# Patient Record
Sex: Male | Born: 1985
Health system: Southern US, Community
[De-identification: ages and names within clinical notes are randomized; demographics above are authoritative.]

## PROBLEM LIST (undated history)

## (undated) DIAGNOSIS — F17200 Nicotine dependence, unspecified, uncomplicated: Secondary | ICD-10-CM

## (undated) DIAGNOSIS — H409 Unspecified glaucoma: Secondary | ICD-10-CM

## (undated) DIAGNOSIS — D649 Anemia, unspecified: Secondary | ICD-10-CM

## (undated) DIAGNOSIS — F1020 Alcohol dependence, uncomplicated: Secondary | ICD-10-CM

## (undated) DIAGNOSIS — Z9889 Other specified postprocedural states: Secondary | ICD-10-CM

## (undated) DIAGNOSIS — K37 Unspecified appendicitis: Secondary | ICD-10-CM

## (undated) DIAGNOSIS — K56609 Unspecified intestinal obstruction, unspecified as to partial versus complete obstruction: Secondary | ICD-10-CM

## (undated) DIAGNOSIS — Z8719 Personal history of other diseases of the digestive system: Secondary | ICD-10-CM

## (undated) HISTORY — DX: Alcohol dependence, uncomplicated: F10.20

## (undated) HISTORY — DX: Other specified postprocedural states: Z98.890

## (undated) HISTORY — PX: HERNIA REPAIR: SHX51

## (undated) HISTORY — DX: Unspecified intestinal obstruction, unspecified as to partial versus complete obstruction: K56.609

## (undated) HISTORY — DX: Nicotine dependence, unspecified, uncomplicated: F17.200

## (undated) HISTORY — DX: Personal history of other diseases of the digestive system: Z87.19

## (undated) HISTORY — DX: Anemia, unspecified: D64.9

---
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
## 2012-05-16 ENCOUNTER — Emergency Department: Payer: Self-pay | Admitting: Emergency Medicine

## 2013-01-18 ENCOUNTER — Emergency Department: Payer: Self-pay | Admitting: Emergency Medicine

## 2013-01-19 LAB — BETA STREP CULTURE(ARMC)

## 2013-05-13 DIAGNOSIS — K37 Unspecified appendicitis: Secondary | ICD-10-CM

## 2013-05-13 HISTORY — DX: Unspecified appendicitis: K37

## 2013-08-13 ENCOUNTER — Encounter (HOSPITAL_COMMUNITY): Payer: Self-pay | Admitting: Emergency Medicine

## 2013-08-13 ENCOUNTER — Observation Stay (HOSPITAL_COMMUNITY)
Admission: EM | Admit: 2013-08-13 | Discharge: 2013-08-14 | Disposition: A | Discharge status: Home / Self Care | Payer: Self-pay | Attending: General Surgery | Admitting: General Surgery

## 2013-08-13 ENCOUNTER — Encounter (HOSPITAL_COMMUNITY): Payer: Self-pay | Admitting: Certified Registered"

## 2013-08-13 ENCOUNTER — Emergency Department (HOSPITAL_COMMUNITY): Payer: Self-pay

## 2013-08-13 ENCOUNTER — Encounter (HOSPITAL_COMMUNITY)
Admission: EM | Disposition: A | Discharge status: Home / Self Care | Payer: Self-pay | Source: Home / Self Care | Attending: Emergency Medicine

## 2013-08-13 ENCOUNTER — Emergency Department (HOSPITAL_COMMUNITY): Payer: Self-pay | Admitting: Certified Registered"

## 2013-08-13 DIAGNOSIS — K3580 Unspecified acute appendicitis: Secondary | ICD-10-CM

## 2013-08-13 DIAGNOSIS — Z9049 Acquired absence of other specified parts of digestive tract: Secondary | ICD-10-CM

## 2013-08-13 DIAGNOSIS — F17200 Nicotine dependence, unspecified, uncomplicated: Secondary | ICD-10-CM | POA: Insufficient documentation

## 2013-08-13 HISTORY — PX: LAPAROSCOPIC APPENDECTOMY: SHX408

## 2013-08-13 LAB — CBC WITH DIFFERENTIAL/PLATELET
Basophils Absolute: 0 10*3/uL (ref 0.0–0.1)
Basophils Relative: 0 % (ref 0–1)
Eosinophils Absolute: 0.2 10*3/uL (ref 0.0–0.7)
Eosinophils Relative: 1 % (ref 0–5)
HEMATOCRIT: 42.8 % (ref 39.0–52.0)
HEMOGLOBIN: 15.5 g/dL (ref 13.0–17.0)
LYMPHS PCT: 8 % — AB (ref 12–46)
Lymphs Abs: 1.2 10*3/uL (ref 0.7–4.0)
MCH: 31.7 pg (ref 26.0–34.0)
MCHC: 36.2 g/dL — ABNORMAL HIGH (ref 30.0–36.0)
MCV: 87.5 fL (ref 78.0–100.0)
MONOS PCT: 7 % (ref 3–12)
Monocytes Absolute: 1 10*3/uL (ref 0.1–1.0)
NEUTROS ABS: 12.9 10*3/uL — AB (ref 1.7–7.7)
Neutrophils Relative %: 84 % — ABNORMAL HIGH (ref 43–77)
Platelets: 186 10*3/uL (ref 150–400)
RBC: 4.89 MIL/uL (ref 4.22–5.81)
RDW: 12.6 % (ref 11.5–15.5)
WBC: 15.4 10*3/uL — AB (ref 4.0–10.5)

## 2013-08-13 LAB — COMPREHENSIVE METABOLIC PANEL
ALT: 8 U/L (ref 0–53)
AST: 20 U/L (ref 0–37)
Albumin: 4.4 g/dL (ref 3.5–5.2)
Alkaline Phosphatase: 94 U/L (ref 39–117)
BILIRUBIN TOTAL: 0.7 mg/dL (ref 0.3–1.2)
BUN: 12 mg/dL (ref 6–23)
CALCIUM: 9.9 mg/dL (ref 8.4–10.5)
CHLORIDE: 99 meq/L (ref 96–112)
CO2: 27 meq/L (ref 19–32)
Creatinine, Ser: 0.89 mg/dL (ref 0.50–1.35)
GLUCOSE: 103 mg/dL — AB (ref 70–99)
Potassium: 3.7 mEq/L (ref 3.7–5.3)
Sodium: 141 mEq/L (ref 137–147)
Total Protein: 7.8 g/dL (ref 6.0–8.3)

## 2013-08-13 LAB — URINALYSIS, ROUTINE W REFLEX MICROSCOPIC
Bilirubin Urine: NEGATIVE
Glucose, UA: NEGATIVE mg/dL
Hgb urine dipstick: NEGATIVE
Ketones, ur: 15 mg/dL — AB
LEUKOCYTES UA: NEGATIVE
Nitrite: NEGATIVE
PROTEIN: NEGATIVE mg/dL
Specific Gravity, Urine: 1.005 (ref 1.005–1.030)
UROBILINOGEN UA: 0.2 mg/dL (ref 0.0–1.0)
pH: 5.5 (ref 5.0–8.0)

## 2013-08-13 LAB — LIPASE, BLOOD: LIPASE: 13 U/L (ref 11–59)

## 2013-08-13 SURGERY — APPENDECTOMY, LAPAROSCOPIC
Anesthesia: General | Site: Abdomen

## 2013-08-13 MED ORDER — KETOROLAC TROMETHAMINE 30 MG/ML IJ SOLN
15.0000 mg | Freq: Once | INTRAMUSCULAR | Status: AC | PRN
  Administered 2013-08-13: 30 mg via INTRAVENOUS

## 2013-08-13 MED ORDER — ONDANSETRON HCL 4 MG/2ML IJ SOLN
4.0000 mg | Freq: Once | INTRAMUSCULAR | Status: AC
  Administered 2013-08-13: 4 mg via INTRAVENOUS
  Filled 2013-08-13: qty 2

## 2013-08-13 MED ORDER — HEPARIN SODIUM (PORCINE) 5000 UNIT/ML IJ SOLN
5000.0000 [IU] | Freq: Three times a day (TID) | INTRAMUSCULAR | Status: DC
  Administered 2013-08-14: 5000 [IU] via SUBCUTANEOUS
  Filled 2013-08-13 (×3): qty 1

## 2013-08-13 MED ORDER — KCL IN DEXTROSE-NACL 20-5-0.45 MEQ/L-%-% IV SOLN
INTRAVENOUS | Status: AC
  Filled 2013-08-13: qty 1000

## 2013-08-13 MED ORDER — FENTANYL CITRATE 0.05 MG/ML IJ SOLN
INTRAMUSCULAR | Status: DC | PRN
  Administered 2013-08-13: 50 ug via INTRAVENOUS
  Administered 2013-08-13: 100 ug via INTRAVENOUS

## 2013-08-13 MED ORDER — HYDROMORPHONE HCL PF 1 MG/ML IJ SOLN
1.0000 mg | Freq: Once | INTRAMUSCULAR | Status: AC
Start: 2013-08-13 — End: 2013-08-13
  Administered 2013-08-13: 1 mg via INTRAVENOUS
  Filled 2013-08-13: qty 1

## 2013-08-13 MED ORDER — MIDAZOLAM HCL 5 MG/5ML IJ SOLN
INTRAMUSCULAR | Status: DC | PRN
  Administered 2013-08-13: 2 mg via INTRAVENOUS

## 2013-08-13 MED ORDER — NEOSTIGMINE METHYLSULFATE 1 MG/ML IJ SOLN
INTRAMUSCULAR | Status: DC | PRN
  Administered 2013-08-13: 4 mg via INTRAVENOUS

## 2013-08-13 MED ORDER — MIDAZOLAM HCL 2 MG/2ML IJ SOLN
INTRAMUSCULAR | Status: AC
  Filled 2013-08-13: qty 2

## 2013-08-13 MED ORDER — ONDANSETRON HCL 4 MG/2ML IJ SOLN
INTRAMUSCULAR | Status: AC
  Filled 2013-08-13: qty 2

## 2013-08-13 MED ORDER — MORPHINE SULFATE 2 MG/ML IJ SOLN
2.0000 mg | INTRAMUSCULAR | Status: DC | PRN
  Administered 2013-08-14 (×2): 2 mg via INTRAVENOUS
  Filled 2013-08-13 (×2): qty 1

## 2013-08-13 MED ORDER — MIDAZOLAM HCL 2 MG/2ML IJ SOLN: 2.0000 mg | Freq: Once | INTRAMUSCULAR | Status: DC

## 2013-08-13 MED ORDER — HYDROMORPHONE HCL PF 1 MG/ML IJ SOLN
0.2500 mg | INTRAMUSCULAR | Status: DC | PRN
  Administered 2013-08-13 (×4): 0.5 mg via INTRAVENOUS

## 2013-08-13 MED ORDER — ROCURONIUM BROMIDE 100 MG/10ML IV SOLN
INTRAVENOUS | Status: DC | PRN
  Administered 2013-08-13: 20 mg via INTRAVENOUS

## 2013-08-13 MED ORDER — GLYCOPYRROLATE 0.2 MG/ML IJ SOLN
INTRAMUSCULAR | Status: AC
  Filled 2013-08-13: qty 2

## 2013-08-13 MED ORDER — HYDROMORPHONE HCL PF 1 MG/ML IJ SOLN
1.0000 mg | Freq: Once | INTRAMUSCULAR | Status: AC
  Administered 2013-08-13: 1 mg via INTRAVENOUS
  Filled 2013-08-13: qty 1

## 2013-08-13 MED ORDER — PHENYLEPHRINE 40 MCG/ML (10ML) SYRINGE FOR IV PUSH (FOR BLOOD PRESSURE SUPPORT)
PREFILLED_SYRINGE | INTRAVENOUS | Status: AC
  Filled 2013-08-13: qty 10

## 2013-08-13 MED ORDER — ONDANSETRON HCL 4 MG/2ML IJ SOLN: 4.0000 mg | Freq: Four times a day (QID) | INTRAMUSCULAR | Status: DC | PRN

## 2013-08-13 MED ORDER — ONDANSETRON HCL 4 MG/2ML IJ SOLN
INTRAMUSCULAR | Status: DC | PRN
  Administered 2013-08-13: 4 mg via INTRAVENOUS

## 2013-08-13 MED ORDER — FENTANYL CITRATE 0.05 MG/ML IJ SOLN
INTRAMUSCULAR | Status: AC
  Filled 2013-08-13: qty 5

## 2013-08-13 MED ORDER — BUPIVACAINE-EPINEPHRINE 0.25% -1:200000 IJ SOLN
INTRAMUSCULAR | Status: DC | PRN
  Administered 2013-08-13: 15 mL

## 2013-08-13 MED ORDER — ONDANSETRON HCL 4 MG/2ML IJ SOLN: 4.0000 mg | Freq: Once | INTRAMUSCULAR | Status: AC | PRN

## 2013-08-13 MED ORDER — IOHEXOL 300 MG/ML  SOLN
25.0000 mL | Freq: Once | INTRAMUSCULAR | Status: AC | PRN
  Administered 2013-08-13: 25 mL via ORAL

## 2013-08-13 MED ORDER — IOHEXOL 300 MG/ML  SOLN
100.0000 mL | Freq: Once | INTRAMUSCULAR | Status: AC | PRN
  Administered 2013-08-13: 100 mL via INTRAVENOUS

## 2013-08-13 MED ORDER — LACTATED RINGERS IV SOLN
INTRAVENOUS | Status: DC | PRN
  Administered 2013-08-13 (×2): via INTRAVENOUS

## 2013-08-13 MED ORDER — SUCCINYLCHOLINE CHLORIDE 20 MG/ML IJ SOLN
INTRAMUSCULAR | Status: AC
  Filled 2013-08-13: qty 1

## 2013-08-13 MED ORDER — BUPIVACAINE-EPINEPHRINE (PF) 0.25% -1:200000 IJ SOLN
INTRAMUSCULAR | Status: AC
  Filled 2013-08-13: qty 30

## 2013-08-13 MED ORDER — SODIUM CHLORIDE 0.9 % IR SOLN
Status: DC | PRN
  Administered 2013-08-13: 1000 mL

## 2013-08-13 MED ORDER — ACETAMINOPHEN 325 MG PO TABS: 650.0000 mg | ORAL_TABLET | ORAL | Status: DC | PRN

## 2013-08-13 MED ORDER — PROPOFOL 10 MG/ML IV BOLUS
INTRAVENOUS | Status: DC | PRN
  Administered 2013-08-13: 200 mg via INTRAVENOUS

## 2013-08-13 MED ORDER — LIDOCAINE HCL (CARDIAC) 20 MG/ML IV SOLN
INTRAVENOUS | Status: AC
  Filled 2013-08-13: qty 5

## 2013-08-13 MED ORDER — ESMOLOL HCL 10 MG/ML IV SOLN
INTRAVENOUS | Status: AC
  Filled 2013-08-13: qty 10

## 2013-08-13 MED ORDER — HYDROMORPHONE HCL PF 1 MG/ML IJ SOLN
INTRAMUSCULAR | Status: AC
  Administered 2013-08-13: 0.5 mg via INTRAVENOUS
  Filled 2013-08-13: qty 1

## 2013-08-13 MED ORDER — DEXAMETHASONE SODIUM PHOSPHATE 4 MG/ML IJ SOLN
INTRAMUSCULAR | Status: AC
  Filled 2013-08-13: qty 2

## 2013-08-13 MED ORDER — DEXAMETHASONE SODIUM PHOSPHATE 4 MG/ML IJ SOLN
INTRAMUSCULAR | Status: DC | PRN
  Administered 2013-08-13: 8 mg via INTRAVENOUS

## 2013-08-13 MED ORDER — PROPOFOL 10 MG/ML IV BOLUS
INTRAVENOUS | Status: AC
  Filled 2013-08-13: qty 20

## 2013-08-13 MED ORDER — SUCCINYLCHOLINE CHLORIDE 20 MG/ML IJ SOLN
INTRAMUSCULAR | Status: DC | PRN
Start: 2013-08-13 — End: 2013-08-13
  Administered 2013-08-13: 100 mg via INTRAVENOUS

## 2013-08-13 MED ORDER — NEOSTIGMINE METHYLSULFATE 1 MG/ML IJ SOLN
INTRAMUSCULAR | Status: AC
  Filled 2013-08-13: qty 10

## 2013-08-13 MED ORDER — LIDOCAINE HCL (CARDIAC) 20 MG/ML IV SOLN
INTRAVENOUS | Status: DC | PRN
  Administered 2013-08-13: 50 mg via INTRAVENOUS

## 2013-08-13 MED ORDER — BUPIVACAINE HCL (PF) 0.25 % IJ SOLN
INTRAMUSCULAR | Status: AC
  Filled 2013-08-13: qty 30

## 2013-08-13 MED ORDER — KCL IN DEXTROSE-NACL 20-5-0.45 MEQ/L-%-% IV SOLN
INTRAVENOUS | Status: DC
  Administered 2013-08-13: 22:00:00 via INTRAVENOUS
  Filled 2013-08-13 (×3): qty 1000

## 2013-08-13 MED ORDER — OXYCODONE-ACETAMINOPHEN 5-325 MG PO TABS
1.0000 | ORAL_TABLET | ORAL | Status: DC | PRN
  Administered 2013-08-13 – 2013-08-14 (×2): 2 via ORAL
  Filled 2013-08-13 (×2): qty 2

## 2013-08-13 MED ORDER — SODIUM CHLORIDE 0.9 % IV SOLN
1.0000 g | Freq: Once | INTRAVENOUS | Status: AC
  Administered 2013-08-13: 1 g via INTRAVENOUS
  Filled 2013-08-13: qty 1

## 2013-08-13 MED ORDER — KETOROLAC TROMETHAMINE 30 MG/ML IJ SOLN
INTRAMUSCULAR | Status: AC
  Administered 2013-08-13: 30 mg via INTRAVENOUS
  Filled 2013-08-13: qty 1

## 2013-08-13 MED ORDER — GLYCOPYRROLATE 0.2 MG/ML IJ SOLN
INTRAMUSCULAR | Status: DC | PRN
  Administered 2013-08-13: 0.6 mg via INTRAVENOUS
  Administered 2013-08-13: 0.2 mg via INTRAVENOUS

## 2013-08-13 MED ORDER — ONDANSETRON HCL 4 MG PO TABS: 4.0000 mg | ORAL_TABLET | Freq: Four times a day (QID) | ORAL | Status: DC | PRN

## 2013-08-13 SURGICAL SUPPLY — 48 items
ADH SKN CLS APL DERMABOND .7 (GAUZE/BANDAGES/DRESSINGS) ×1
APPLIER CLIP ROT 10 11.4 M/L (STAPLE)
APR CLP MED LRG 11.4X10 (STAPLE)
BAG SPEC RTRVL LRG 6X4 10 (ENDOMECHANICALS) ×2
BLADE SURG ROTATE 9660 (MISCELLANEOUS) ×2 IMPLANT
CANISTER SUCTION 2500CC (MISCELLANEOUS) ×3 IMPLANT
CHLORAPREP W/TINT 26ML (MISCELLANEOUS) ×3 IMPLANT
CLIP APPLIE ROT 10 11.4 M/L (STAPLE) IMPLANT
COVER SURGICAL LIGHT HANDLE (MISCELLANEOUS) ×3 IMPLANT
CUTTER LINEAR ENDO 35 ETS (STAPLE) IMPLANT
CUTTER LINEAR ENDO 35 ETS TH (STAPLE) ×3 IMPLANT
DECANTER SPIKE VIAL GLASS SM (MISCELLANEOUS) IMPLANT
DERMABOND ADVANCED (GAUZE/BANDAGES/DRESSINGS) ×2
DERMABOND ADVANCED .7 DNX12 (GAUZE/BANDAGES/DRESSINGS) ×1 IMPLANT
DRAPE UTILITY 15X26 W/TAPE STR (DRAPE) ×6 IMPLANT
ELECT REM PT RETURN 9FT ADLT (ELECTROSURGICAL) ×3
ELECTRODE REM PT RTRN 9FT ADLT (ELECTROSURGICAL) ×1 IMPLANT
ENDOLOOP SUT PDS II  0 18 (SUTURE)
ENDOLOOP SUT PDS II 0 18 (SUTURE) IMPLANT
GLOVE BIO SURGEON STRL SZ8 (GLOVE) ×3 IMPLANT
GLOVE BIOGEL PI IND STRL 8 (GLOVE) ×1 IMPLANT
GLOVE BIOGEL PI INDICATOR 8 (GLOVE) ×2
GOWN STRL REUS W/ TWL LRG LVL3 (GOWN DISPOSABLE) ×2 IMPLANT
GOWN STRL REUS W/ TWL XL LVL3 (GOWN DISPOSABLE) ×1 IMPLANT
GOWN STRL REUS W/TWL LRG LVL3 (GOWN DISPOSABLE) ×9
GOWN STRL REUS W/TWL XL LVL3 (GOWN DISPOSABLE) ×3
KIT BASIN OR (CUSTOM PROCEDURE TRAY) ×3 IMPLANT
KIT ROOM TURNOVER OR (KITS) ×3 IMPLANT
NEEDLE 22X1 1/2 (OR ONLY) (NEEDLE) ×3 IMPLANT
NS IRRIG 1000ML POUR BTL (IV SOLUTION) ×3 IMPLANT
PAD ARMBOARD 7.5X6 YLW CONV (MISCELLANEOUS) ×6 IMPLANT
POUCH SPECIMEN RETRIEVAL 10MM (ENDOMECHANICALS) ×5 IMPLANT
RELOAD /EVU35 (ENDOMECHANICALS) IMPLANT
RELOAD CUTTER ETS 35MM STAND (ENDOMECHANICALS) IMPLANT
SCALPEL HARMONIC ACE (MISCELLANEOUS) ×3 IMPLANT
SET IRRIG TUBING LAPAROSCOPIC (IRRIGATION / IRRIGATOR) ×3 IMPLANT
SPECIMEN JAR SMALL (MISCELLANEOUS) ×3 IMPLANT
SUT VIC AB 4-0 PS2 27 (SUTURE) ×3 IMPLANT
SWAB COLLECTION DEVICE MRSA (MISCELLANEOUS) IMPLANT
TOWEL OR 17X24 6PK STRL BLUE (TOWEL DISPOSABLE) ×3 IMPLANT
TOWEL OR 17X26 10 PK STRL BLUE (TOWEL DISPOSABLE) ×3 IMPLANT
TRAY FOLEY CATH 16FR SILVER (SET/KITS/TRAYS/PACK) ×3 IMPLANT
TRAY LAPAROSCOPIC (CUSTOM PROCEDURE TRAY) ×3 IMPLANT
TROCAR XCEL 12X100 BLDLESS (ENDOMECHANICALS) ×3 IMPLANT
TROCAR XCEL BLUNT TIP 100MML (ENDOMECHANICALS) ×3 IMPLANT
TROCAR XCEL NON-BLD 5MMX100MML (ENDOMECHANICALS) ×3 IMPLANT
TUBE ANAEROBIC SPECIMEN COL (MISCELLANEOUS) IMPLANT
WATER STERILE IRR 1000ML POUR (IV SOLUTION) ×1 IMPLANT

## 2013-08-13 NOTE — Transfer of Care (Signed)
Immediate Anesthesia Transfer of Care Note  Patient: Mitchell [name]  Procedure(s) Performed: Procedure(s): APPENDECTOMY LAPAROSCOPIC (N/A)  Patient Location: PACU  Anesthesia Type:General  Level of Consciousness: awake, alert , oriented and patient cooperative  Airway & Oxygen Therapy: Patient Spontanous Breathing and Patient connected to nasal cannula oxygen  Post-op Assessment: Report given to PACU RN, Post -op Vital signs reviewed and stable and Patient moving all extremities  Post vital signs: Reviewed and stable  Complications: No apparent anesthesia complications

## 2013-08-13 NOTE — ED Notes (Signed)
Pt. Complaints of upper abdominal cramping with N/V since yesterday. Pt. Curled over in bed, guarding abdomen.

## 2013-08-13 NOTE — Anesthesia Procedure Notes (Signed)
Procedure Name: Intubation Date/Time: 08/13/2013 8:27 PM Performed by: Jerilee HohMUMM, Arney Mayabb N Pre-anesthesia Checklist: Patient identified, Emergency Drugs available, Suction available and Patient being monitored Patient Re-evaluated:Patient Re-evaluated prior to inductionOxygen Delivery Method: Circle system utilized Preoxygenation: Pre-oxygenation with 100% oxygen Intubation Type: IV induction, Rapid sequence and Cricoid Pressure applied Laryngoscope Size: Mac and 4 Grade View: Grade II Tube type: Oral Tube size: 7.5 mm Number of attempts: 1 Airway Equipment and Method: Stylet Placement Confirmation: ETT inserted through vocal cords under direct vision,  positive ETCO2 and breath sounds checked- equal and bilateral Secured at: 21 cm Tube secured with: Tape Dental Injury: Teeth and Oropharynx as per pre-operative assessment

## 2013-08-13 NOTE — H&P (Signed)
Mitchell [name] is an 28 y.o. male.   Chief Complaint: abdominal pain HPI: patient developed epigastric pain last night. It persistes and he had associated nausea. He came to the ED for evaluation. He has leukocytosis of 15k and CT abdomen and pelvis shows acute appendicitis. History reviewed. No pertinent past medical history.  History reviewed. No pertinent past surgical history.  History reviewed. No pertinent family history. Social History:  reports that he has been smoking.  He does not have any smokeless tobacco history on file. His alcohol and drug histories are not on file.  Allergies: No Known Allergies   (Not in a hospital admission)  Results for orders placed during the hospital encounter of 08/13/13 (from the past 48 hour(s))  CBC WITH DIFFERENTIAL     Status: Abnormal   Collection Time    08/13/13  3:18 PM      Result Value Ref Range   WBC 15.4 (*) 4.0 - 10.5 K/uL   RBC 4.89  4.22 - 5.81 MIL/uL   Hemoglobin 15.5  13.0 - 17.0 g/dL   HCT 42.8  39.0 - 52.0 %   MCV 87.5  78.0 - 100.0 fL   MCH 31.7  26.0 - 34.0 pg   MCHC 36.2 (*) 30.0 - 36.0 g/dL   RDW 12.6  11.5 - 15.5 %   Platelets 186  150 - 400 K/uL   Neutrophils Relative % 84 (*) 43 - 77 %   Neutro Abs 12.9 (*) 1.7 - 7.7 K/uL   Lymphocytes Relative 8 (*) 12 - 46 %   Lymphs Abs 1.2  0.7 - 4.0 K/uL   Monocytes Relative 7  3 - 12 %   Monocytes Absolute 1.0  0.1 - 1.0 K/uL   Eosinophils Relative 1  0 - 5 %   Eosinophils Absolute 0.2  0.0 - 0.7 K/uL   Basophils Relative 0  0 - 1 %   Basophils Absolute 0.0  0.0 - 0.1 K/uL  COMPREHENSIVE METABOLIC PANEL     Status: Abnormal   Collection Time    08/13/13  3:18 PM      Result Value Ref Range   Sodium 141  137 - 147 mEq/L   Potassium 3.7  3.7 - 5.3 mEq/L   Chloride 99  96 - 112 mEq/L   CO2 27  19 - 32 mEq/L   Glucose, Bld 103 (*) 70 - 99 mg/dL   BUN 12  6 - 23 mg/dL   Creatinine, Ser 0.89  0.50 - 1.35 mg/dL   Calcium 9.9  8.4 - 10.5 mg/dL   Total Protein 7.8   6.0 - 8.3 g/dL   Albumin 4.4  3.5 - 5.2 g/dL   AST 20  0 - 37 U/L   ALT 8  0 - 53 U/L   Alkaline Phosphatase 94  39 - 117 U/L   Total Bilirubin 0.7  0.3 - 1.2 mg/dL   GFR calc non Af Amer >90  >90 mL/min   GFR calc Af Amer >90  >90 mL/min   Comment: (NOTE)     The eGFR has been calculated using the CKD EPI equation.     This calculation has not been validated in all clinical situations.     eGFR's persistently <90 mL/min signify possible Chronic Kidney     Disease.  LIPASE, BLOOD     Status: None   Collection Time    08/13/13  3:18 PM      Result Value Ref Range  Lipase 13  11 - 59 U/L   Ct Abdomen Pelvis W Contrast  08/13/2013   CLINICAL DATA:  Abdominal pain, nausea  EXAM: CT ABDOMEN AND PELVIS WITH CONTRAST  TECHNIQUE: Multidetector CT imaging of the abdomen and pelvis was performed using the standard protocol following bolus administration of intravenous contrast.  CONTRAST:  166m OMNIPAQUE IOHEXOL 300 MG/ML  SOLN  COMPARISON:  None.  FINDINGS: Lung bases are unremarkable. Sagittal images of the spine are unremarkable. Liver, pancreas, spleen and adrenals are unremarkable. No calcified gallstones are noted within gallbladder. Abdominal aorta is unremarkable. Kidneys are unremarkable. No hydronephrosis or hydroureter. Moderate stool in transverse colon. No small bowel obstruction. There is subtle mild stranding of pericolonic fat in right lower quadrant  A low lying cecum is noted. Nonspecific lymph nodes are noted in right lower quadrant mesentery the largest measures 1.2 cm in coronal image 41.  Moderate distension of the cecum with gas and stool. There is partially visualized abnormal appendix. There is a calcified appendicolith at the appendix base measures about 1 cm best seen in sagittal image 57. There is distension of the appendix with fluid the appendix measures at least 1 cm in diameter. The appendix is located just above the urinary bladder best seen in sagittal image 59. A second  appendicolith is noted within appendix measures about 7 mm. Findings are consistent with acute appendicitis.  The appendix is visualized in axial image 67. The appendix is somewhat medial to the cecum just above the urinary bladder.  IMPRESSION: 1. Findings highly suspicious for acute appendicitis. At least 2 appendicoliths are noted as described above. There is a low lying cecum. Stool and gas noted within cecum. 2. No hydronephrosis or hydroureter. 3. No small bowel obstruction.  These results were called by telephone at the time of interpretation on 08/13/2013 at 5:49 PM to Dr. TJeannett Senior, who verbally acknowledged these results.   Electronically Signed   By: LLahoma CrockerM.D.   On: 08/13/2013 17:49   Dg Abd Acute W/chest  08/13/2013   CLINICAL DATA:  Abdominal pain and vomiting  EXAM: ACUTE ABDOMEN SERIES (ABDOMEN 2 VIEW & CHEST 1 VIEW)  COMPARISON:  None.  FINDINGS: PA chest: Lungs are clear. Heart size and pulmonary vascularity are normal. No adenopathy.  Supine and upright abdomen: There is moderate stool in the colon. There is no bowel dilatation or air-fluid level suggesting obstruction. No free air. No abnormal calcifications.  IMPRESSION: Bowel gas pattern unremarkable.  Lungs clear.   Electronically Signed   By: WLowella GripM.D.   On: 08/13/2013 16:14    Review of Systems  Constitutional: Positive for malaise/fatigue. Negative for chills.  HENT: Negative.   Eyes: Negative.   Cardiovascular: Negative.   Gastrointestinal: Positive for nausea and abdominal pain.  Genitourinary: Negative.   Musculoskeletal: Negative.   Skin: Negative.   Neurological: Negative.   Endo/Heme/Allergies: Negative.   Psychiatric/Behavioral: Negative.     Blood pressure 131/72, pulse 62, temperature 98.9 F (37.2 C), temperature source Oral, resp. rate 18, SpO2 94.00%. Physical Exam  Constitutional: He is oriented to person, place, and time. He appears well-developed and well-nourished. No distress.   HENT:  Head: Normocephalic and atraumatic.  Mouth/Throat: No oropharyngeal exudate.  Eyes: EOM are normal. Pupils are equal, round, and reactive to light. No scleral icterus.  Neck: Normal range of motion. Neck supple. No tracheal deviation present.  Cardiovascular: Normal rate, regular rhythm, normal heart sounds and intact distal pulses.   Respiratory:  Effort normal and breath sounds normal. No stridor. No respiratory distress. He has no wheezes. He has no rales.  GI: Soft. He exhibits no distension. There is tenderness. There is no rebound and no guarding.  Tender RLQ  Musculoskeletal: Normal range of motion. He exhibits no tenderness.  Neurological: He is alert and oriented to person, place, and time. He exhibits normal muscle tone.  Skin: Skin is warm.  Multiple tattoos     Assessment/Plan Acute appendicitis - IV ABX and will take to the OR tonight for laparoscopic appendectomy. Procedure. Risks, benefits D/W him and he agrees.  Nikya Busler E 08/13/2013, 6:32 PM

## 2013-08-13 NOTE — Anesthesia Postprocedure Evaluation (Signed)
  Anesthesia Post-op Note  Patient: Mitchell [name]  Procedure(s) Performed: Procedure(s): APPENDECTOMY LAPAROSCOPIC (N/A)  Patient Location: PACU  Anesthesia Type:General  Level of Consciousness: awake, alert  and oriented  Airway and Oxygen Therapy: Patient Spontanous Breathing and Patient connected to nasal cannula oxygen  Post-op Pain: mild  Post-op Assessment: Post-op Vital signs reviewed, Patient's Cardiovascular Status Stable, Respiratory Function Stable, Patent Airway and Pain level controlled  Post-op Vital Signs: stable  Complications: No apparent anesthesia complications

## 2013-08-13 NOTE — Op Note (Signed)
08/13/2013  9:01 PM  PATIENT:  Mitchell [name]  28 y.o. male  PRE-OPERATIVE DIAGNOSIS:   Appendicitis  POST-OPERATIVE DIAGNOSIS:   Appendicitis  PROCEDURE:  Procedure(s): APPENDECTOMY LAPAROSCOPIC  SURGEON:  Surgeon(s): Liz MaladyBurke E Omari Mcmanaway, MD  ASSISTANTS: Gladstone PiheCe Yelverton, RNFA   ANESTHESIA:   local and general  EBL:  Total I/O In: 1000 [I.V.:1000] Out: 150 [Urine:150]  BLOOD ADMINISTERED:none  DRAINS: none   SPECIMEN:  Excision  DISPOSITION OF SPECIMEN:  PATHOLOGY  COUNTS:  YES  DICTATION: .Dragon Dictation Patient is brought for emergency appendectomy. He was done for the prep holding area. He he received intravenous antibiotics. Informed consent was obtained. He was brought to the operating room and general endotracheal anesthesia was administered by the anesthesia staff. Foley catheter was placed by nursing. Abdomen was prepped and draped in sterile fashion. Time out procedure was done. Infraumbilical region was infiltrated with local. Infraumbilical incision was made. Subcutaneous tissues were dissected down revealing the anterior fascia. This was divided along the midline. Peritoneal cavity was entered under direct vision. 0 Vicryl pursestring suture was placed around the fascial opening. Hassan trocar was inserted. Abdomen was insufflated with carbon dioxide. Under direct vision, a 12 mm left lower quadrant and 5 mm right mid abdomen port were placed. Local was used at each port site. Laparoscopic exploration revealed a very inflamed but not perforated appendix. The mesoappendix was divided with the harmonic scalpel achieving excellent hemostasis. The base of the appendix was divided with Endo GIA achieving great staple line. Appendix was placed in an Endo Catch bag and removed from the left lower corner port site. Abdomen was copiously irrigated. Was no bleeding. Good staple line on the cecum. Pneumoperitoneum was released. Ports were removed under direct vision. Infra-umbilical  fascia was closed by tying the pursestring. All 3 wounds were copiously irrigated and the skin of each was closed with running 4 Vicryl subcuticular followed by Dermabond. All counts were correct. Patient tolerated procedure well without apparent complication was taken recovery in stable condition.  PATIENT DISPOSITION:  PACU - hemodynamically stable.   Delay start of Pharmacological VTE agent (>24hrs) due to surgical blood loss or risk of bleeding:  no  Violeta GelinasBurke Carine Nordgren, MD, MPH, FACS Pager: [phone]  4/3/20159:01 PM

## 2013-08-13 NOTE — ED Provider Notes (Signed)
CSN: [id number]     Arrival date & time 08/13/13  1340 History   First MD Initiated Contact with Patient 08/13/13 1510     Chief Complaint  Patient presents with  . Abdominal Pain     (Consider location/radiation/quality/duration/timing/severity/associated sxs/prior Treatment) HPI Mitchell [name] is a 28 y.o. male who presents to emergency department complaining of upper abdominal pain since yesterday. Patient states he ate pizza last night, and shortly after developed the pain. Patient states it has been constant since then but is waxing and waning. States it is worsening today. He admits to nausea vomiting. Denies any changes in bowel movements. Denies any pain or flank. No urinary symptoms or hematuria. He took some Pepto-Bismol today with no relief of his symptoms. He states that he has had similar pain in the past but never this severe. He has never been evaluated for this pain in the past. He denies any medical history or any prior abdominal diagnoses. No abdominal surgeries in the past. He does admit to heavy alcohol, states drinks daily, large amounts. He denies any use of NSAIDs. He is a smoker. Does not drink a lot of caffeine. He denies any blood in his stool or emesis.  History reviewed. No pertinent past medical history. History reviewed. No pertinent past surgical history. History reviewed. No pertinent family history. History  Substance Use Topics  . Smoking status: Current Every Day Smoker  . Smokeless tobacco: Not on file  . Alcohol Use: Not on file    Review of Systems  Constitutional: Negative for fever and chills.  Respiratory: Negative for cough, chest tightness and shortness of breath.   Cardiovascular: Negative for chest pain, palpitations and leg swelling.  Gastrointestinal: Positive for nausea, vomiting and abdominal pain. Negative for diarrhea, blood in stool and abdominal distention.  Genitourinary: Negative for dysuria, urgency, frequency, hematuria and flank  pain.  Musculoskeletal: Negative for arthralgias, myalgias, neck pain and neck stiffness.  Skin: Negative for rash.  Allergic/Immunologic: Negative for immunocompromised state.  Neurological: Negative for dizziness, weakness, light-headedness, numbness and headaches.      Allergies  Review of patient's allergies indicates no known allergies.  Home Medications  No current outpatient prescriptions on file. BP 135/95  Pulse 64  Temp(Src) 97.6 F (36.4 C) (Oral)  Resp 18  SpO2 99% Physical Exam  Nursing note and vitals reviewed. Constitutional: He appears well-developed and well-nourished.  Appears to be in severe pain  HENT:  Head: Normocephalic and atraumatic.  Eyes: Conjunctivae are normal.  Neck: Neck supple.  Cardiovascular: Normal rate, regular rhythm and normal heart sounds.   Pulmonary/Chest: Effort normal. No respiratory distress. He has no wheezes. He has no rales.  Abdominal: Soft. Bowel sounds are normal. He exhibits no distension. There is tenderness. There is no rebound.  RUQ, LUQ, epigastric tenderness. No CVA tenderness  Musculoskeletal: He exhibits no edema.  Neurological: He is alert.  Skin: Skin is warm and dry.    ED Course  Procedures (including critical care time) Labs Review Labs Reviewed  CBC WITH DIFFERENTIAL - Abnormal; Notable for the following:    WBC 15.4 (*)    MCHC 36.2 (*)    Neutrophils Relative % 84 (*)    Neutro Abs 12.9 (*)    Lymphocytes Relative 8 (*)    All other components within normal limits  COMPREHENSIVE METABOLIC PANEL - Abnormal; Notable for the following:    Glucose, Bld 103 (*)    All other components within normal limits  LIPASE,  BLOOD  URINALYSIS, ROUTINE W REFLEX MICROSCOPIC   Imaging Review Ct Abdomen Pelvis W Contrast  08/13/2013   CLINICAL DATA:  Abdominal pain, nausea  EXAM: CT ABDOMEN AND PELVIS WITH CONTRAST  TECHNIQUE: Multidetector CT imaging of the abdomen and pelvis was performed using the standard  protocol following bolus administration of intravenous contrast.  CONTRAST:  OMNIPAQUE IOHEXOL 300 MG/ML  SOLN  COMPARISON:  None.  FINDINGS: Lung bases are unremarkable. Sagittal images of the spine are unremarkable. Liver, pancreas, spleen and adrenals are unremarkable. No calcified gallstones are noted within gallbladder. Abdominal aorta is unremarkable. Kidneys are unremarkable. No hydronephrosis or hydroureter. Moderate stool in transverse colon. No small bowel obstruction. There is subtle mild stranding of pericolonic fat in right lower quadrant  A low lying cecum is noted. Nonspecific lymph nodes are noted in right lower quadrant mesentery the largest measures 1.2 cm in coronal image 41.  Moderate distension of the cecum with gas and stool. There is partially visualized abnormal appendix. There is a calcified appendicolith at the appendix base measures about 1 cm best seen in sagittal image 57. There is distension of the appendix with fluid the appendix measures at least 1 cm in diameter. The appendix is located just above the urinary bladder best seen in sagittal image 59. A second appendicolith is noted within appendix measures about 7 mm. Findings are consistent with acute appendicitis.  The appendix is visualized in axial image 67. The appendix is somewhat medial to the cecum just above the urinary bladder.  IMPRESSION: 1. Findings highly suspicious for acute appendicitis. At least 2 appendicoliths are noted as described above. There is a low lying cecum. Stool and gas noted within cecum. 2. No hydronephrosis or hydroureter. 3. No small bowel obstruction.  These results were called by telephone at the time of interpretation on 08/13/2013 at 5:49 PM to Dr. Jaynie Crumble , who verbally acknowledged these results.   Electronically Signed   By: Natasha Mead M.D.   On: 08/13/2013 17:49   Dg Abd Acute W/chest  08/13/2013   CLINICAL DATA:  Abdominal pain and vomiting  EXAM: ACUTE ABDOMEN SERIES (ABDOMEN  2 VIEW & CHEST 1 VIEW)  COMPARISON:  None.  FINDINGS: PA chest: Lungs are clear. Heart size and pulmonary vascularity are normal. No adenopathy.  Supine and upright abdomen: There is moderate stool in the colon. There is no bowel dilatation or air-fluid level suggesting obstruction. No free air. No abnormal calcifications.  IMPRESSION: Bowel gas pattern unremarkable.  Lungs clear.   Electronically Signed   By: Bretta Bang M.D.   On: 08/13/2013 16:14     EKG Interpretation None      MDM   Final diagnoses:  Acute appendicitis    Patient's with severe epigastric abdominal pain. No hematemesis or blood in his stool. No dark stools. We'll get labs, CBC, CMP, lipase. Patient's a heavy drinker, suspect gastritis versus peptic ulcer disease versus pancreatitis. Pain medications and antiemetics ordered.  4:28 PM Pt now admits to two episodes of emesis at home that was "black in color with chunks." Pt denies any black discoloration to the last few episodes. denies bright red blood.   7:03 PM CT abd was obtained given elevated WBC and continued pain and tenderness. CT showed acute appendicitis. Discussed with Dr. Janee Morn, will take to OR.   Filed Vitals:   08/13/13 1645 08/13/13 1730 08/13/13 1745 08/13/13 1830  BP: 141/84 117/69 131/72 139/88  Pulse: 56 57 62 55  Temp:  TempSrc:      Resp:      SpO2: 99% 94% 94% 98%     Lottie Mussel, PA-C 08/13/13 1903

## 2013-08-13 NOTE — ED Notes (Signed)
Surgeon at bedside.  

## 2013-08-13 NOTE — Anesthesia Preprocedure Evaluation (Signed)
Anesthesia Evaluation  Patient identified by MRN, date of birth, ID band Patient awake    Reviewed: Allergy & Precautions, H&P , NPO status , Patient's Chart, lab work & pertinent test results  Airway Mallampati: II TM Distance: >3 FB Neck ROM: Full    Dental  (+) Teeth Intact, Dental Advisory Given   Pulmonary Current Smoker,  breath sounds clear to auscultation        Cardiovascular Rhythm:Regular Rate:Normal     Neuro/Psych    GI/Hepatic   Endo/Other    Renal/GU      Musculoskeletal   Abdominal   Peds  Hematology   Anesthesia Other Findings   Reproductive/Obstetrics                           Anesthesia Physical Anesthesia Plan  ASA: II and emergent  Anesthesia Plan: General   Post-op Pain Management:    Induction: Intravenous  Airway Management Planned: Oral ETT  Additional Equipment:   Intra-op Plan:   Post-operative Plan: Extubation in OR  Informed Consent: I have reviewed the patients History and Physical, chart, labs and discussed the procedure including the risks, benefits and alternatives for the proposed anesthesia with the patient or authorized representative who has indicated his/her understanding and acceptance.   Dental advisory given  Plan Discussed with: CRNA and Anesthesiologist  Anesthesia Plan Comments: (Acute Appendicitis Smoker  Plan GA with RSI  Kipp Broodavid Capri Raben, MD)        Anesthesia Quick Evaluation

## 2013-08-13 NOTE — ED Notes (Signed)
Pt in c/o upper abd pain with n/v since last night, denies fever at home

## 2013-08-14 MED ORDER — POLYETHYLENE GLYCOL 3350 17 G PO PACK
17.0000 g | PACK | Freq: Every day | ORAL | Status: DC
  Filled 2013-08-14: qty 1

## 2013-08-14 MED ORDER — OXYCODONE-ACETAMINOPHEN 5-325 MG PO TABS: 1.0000 | ORAL_TABLET | ORAL | Status: DC | PRN

## 2013-08-14 MED ORDER — DIPHENHYDRAMINE HCL 50 MG/ML IJ SOLN
25.0000 mg | Freq: Four times a day (QID) | INTRAMUSCULAR | Status: DC | PRN
  Administered 2013-08-14: 25 mg via INTRAVENOUS
  Filled 2013-08-14: qty 1

## 2013-08-14 NOTE — ED Provider Notes (Signed)
Medical screening examination/treatment/procedure(s) were performed by non-physician practitioner and as supervising physician I was immediately available for consultation/collaboration.   EKG Interpretation None        Junius ArgyleForrest S Marcele Kosta, MD 08/14/13 1239

## 2013-08-14 NOTE — Discharge Summary (Signed)
   Patient ID: Mitchell MessickBrian D [name] [id number] 28 y.o. 11-21-1985  08/13/2013  Discharge date and time: 08/14/2013   Admitting Physician: Violeta Gelinashompson, Burke  Discharge Physician: Glenna FellowsHOXWORTH,Alexzandra Bilton T  Admission Diagnoses: Acute appendicitis [540.9]  Discharge Diagnoses: same  Operations: Procedure(s): APPENDECTOMY LAPAROSCOPIC  Admission Condition: fair  Discharged Condition: good  Indication for Admission: patient is a 28 year old male presenting with typical symptoms, physical findings and imaging for acute appendicitis.  Hospital Course: patient was admitted, given broad-spectrum IV antibiotics and underwent an uneventful laparoscopic appendectomy for acute nonperforated appendicitis. The following morning his pain is significantly improved. He is up and about and tolerating a diet. Abdomen is benign and wounds healing well. He was felt ready for discharge.   Disposition: Home  Patient Instructions:    Medication List         oxyCODONE-acetaminophen 5-325 MG per tablet  Commonly known as:  PERCOCET/ROXICET  Take 1-2 tablets by mouth every 4 (four) hours as needed for moderate pain.        Activity: activity as tolerated Diet: regular diet Wound Care: none needed  Follow-up:  With Dr. Janee Mornhompson in 2 weeks.  Signed: Mariella SaaBenjamin T Trevonte Ashkar MD, FACS  08/14/2013, 8:49 AM

## 2013-08-14 NOTE — Progress Notes (Signed)
Patient ID: Mitchell [name], male   DOB: [birth year], 28 y.o.   MRN: [id number] 1 Day Post-Op  Subjective: Sore but feels better. Pain relieved. Tolerating diet without nausea.  Objective: Vital signs in last 24 hours: Temp:  [97.6 F (36.4 C)-99.4 F (37.4 C)] 99.4 F (37.4 C) (04/04 0602) Pulse Rate:  [47-84] 69 (04/04 0602) Resp:  [11-20] 18 (04/04 0602) BP: (115-148)/(60-95) 137/84 mmHg (04/04 0602) SpO2:  [94 %-100 %] 100 % (04/04 0602) Weight:  [173 lb (78.472 kg)] 173 lb (78.472 kg) (04/04 0037) Last BM Date: 08/13/13  Intake/Output from previous day: 04/03 0701 - 04/04 0700 In: 1688.3 [I.V.:1688.3] Out: 200 [Urine:150; Blood:50] Intake/Output this shift:    General appearance: alert, cooperative and no distress GI: minimal right lower quadrant tenderness, no guarding Incision/Wound: clean and dry without evidence of infection  Lab Results:   Recent Labs  08/13/13 1518  WBC 15.4*  HGB 15.5  HCT 42.8  PLT 186   BMET  Recent Labs  08/13/13 1518  NA 141  K 3.7  CL 99  CO2 27  GLUCOSE 103*  BUN 12  CREATININE 0.89  CALCIUM 9.9     Studies/Results: Ct Abdomen Pelvis W Contrast  08/13/2013   CLINICAL DATA:  Abdominal pain, nausea  EXAM: CT ABDOMEN AND PELVIS WITH CONTRAST  TECHNIQUE: Multidetector CT imaging of the abdomen and pelvis was performed using the standard protocol following bolus administration of intravenous contrast.  CONTRAST:  100mL OMNIPAQUE IOHEXOL 300 MG/ML  SOLN  COMPARISON:  None.  FINDINGS: Lung bases are unremarkable. Sagittal images of the spine are unremarkable. Liver, pancreas, spleen and adrenals are unremarkable. No calcified gallstones are noted within gallbladder. Abdominal aorta is unremarkable. Kidneys are unremarkable. No hydronephrosis or hydroureter. Moderate stool in transverse colon. No small bowel obstruction. There is subtle mild stranding of pericolonic fat in right lower quadrant  A low lying cecum is noted. Nonspecific  lymph nodes are noted in right lower quadrant mesentery the largest measures 1.2 cm in coronal image 41.  Moderate distension of the cecum with gas and stool. There is partially visualized abnormal appendix. There is a calcified appendicolith at the appendix base measures about 1 cm best seen in sagittal image 57. There is distension of the appendix with fluid the appendix measures at least 1 cm in diameter. The appendix is located just above the urinary bladder best seen in sagittal image 59. A second appendicolith is noted within appendix measures about 7 mm. Findings are consistent with acute appendicitis.  The appendix is visualized in axial image 67. The appendix is somewhat medial to the cecum just above the urinary bladder.  IMPRESSION: 1. Findings highly suspicious for acute appendicitis. At least 2 appendicoliths are noted as described above. There is a low lying cecum. Stool and gas noted within cecum. 2. No hydronephrosis or hydroureter. 3. No small bowel obstruction.  These results were called by telephone at the time of interpretation on 08/13/2013 at 5:49 PM to Dr. Jaynie CrumbleATYANA KIRICHENKO , who verbally acknowledged these results.   Electronically Signed   By: Natasha MeadLiviu  Pop M.D.   On: 08/13/2013 17:49   Dg Abd Acute W/chest  08/13/2013   CLINICAL DATA:  Abdominal pain and vomiting  EXAM: ACUTE ABDOMEN SERIES (ABDOMEN 2 VIEW & CHEST 1 VIEW)  COMPARISON:  None.  FINDINGS: PA chest: Lungs are clear. Heart size and pulmonary vascularity are normal. No adenopathy.  Supine and upright abdomen: There is moderate stool in the colon. There  is no bowel dilatation or air-fluid level suggesting obstruction. No free air. No abnormal calcifications.  IMPRESSION: Bowel gas pattern unremarkable.  Lungs clear.   Electronically Signed   By: Bretta Bang M.D.   On: 08/13/2013 16:14    Anti-infectives: Anti-infectives   Start     Dose/Rate Route Frequency Ordered Stop   08/13/13 1845  ertapenem (INVANZ) 1 g in sodium  chloride 0.9 % 50 mL IVPB     1 g 100 mL/hr over 30 Minutes Intravenous  Once 08/13/13 1831 08/13/13 2002      Assessment/Plan: s/p Procedure(s): APPENDECTOMY LAPAROSCOPIC Doing well postoperatively without complication. Okay for discharge.   LOS: 1 day    Mitchell [name] 08/14/2013

## 2013-08-14 NOTE — Discharge Instructions (Signed)
[facility] ______[facility] °LAPAROSCOPIC SURGERY: POST OP INSTRUCTIONS °Always review your discharge instruction sheet given to you by the facility where your surgery was performed. °IF YOU HAVE DISABILITY OR FAMILY LEAVE FORMS, YOU MUST BRING THEM TO THE OFFICE FOR PROCESSING.   °DO NOT GIVE THEM TO YOUR DOCTOR. ° °1. A prescription for pain medication may be given to you upon discharge.  Take your pain medication as prescribed, if needed.  If narcotic pain medicine is not needed, then you may take acetaminophen (Tylenol) or ibuprofen (Advil) as needed. °2. Take your usually prescribed medications unless otherwise directed. °3. If you need a refill on your pain medication, please contact your pharmacy.  They will contact our office to request authorization. Prescriptions will not be filled after 5pm or on week-ends. °4. You should follow a light diet the first few days after arrival home, such as soup and crackers, etc.  Be sure to include lots of fluids daily. °5. Most patients will experience some swelling and bruising in the area of the incisions.  Ice packs will help.  Swelling and bruising can take several days to resolve.  °6. It is common to experience some constipation if taking pain medication after surgery.  Increasing fluid intake and taking a stool softener (such as Colace) will usually help or prevent this problem from occurring.  A mild laxative (Milk of Magnesia or Miralax) should be taken according to package instructions if there are no bowel movements after 48 hours. °7. Unless discharge instructions indicate otherwise, you may remove your bandages 24-48 hours after surgery, and you may shower at that time.  You may have steri-strips (small skin tapes) in place directly over the incision.  These strips should be left on the skin for 7-10 days.  If your surgeon used skin glue on the incision, you may shower in 24 hours.  The glue will flake off over the next 2-3 weeks.  Any sutures or  staples will be removed at the office during your follow-up visit. °8. ACTIVITIES:  You may resume regular (light) daily activities beginning the next day--such as daily self-care, walking, climbing stairs--gradually increasing activities as tolerated.  You may have sexual intercourse when it is comfortable.  Refrain from any heavy lifting or straining until approved by your doctor. °a. You may drive when you are no longer taking prescription pain medication, you can comfortably wear a seatbelt, and you can safely maneuver your car and apply brakes. °b. RETURN TO WORK:  __________________________________________________________ °9. You should see your doctor in the office for a follow-up appointment approximately 2-3 weeks after your surgery.  Make sure that you call for this appointment within a day or two after you arrive home to insure a convenient appointment time. °10. OTHER INSTRUCTIONS: __________________________________________________________________________________________________________________________ __________________________________________________________________________________________________________________________ °WHEN TO CALL YOUR DOCTOR: °1. Fever over 101.0 °2. Inability to urinate °3. Continued bleeding from incision. °4. Increased pain, redness, or drainage from the incision. °5. Increasing abdominal pain ° °The clinic staff is available to answer your questions during regular business hours.  Please don’t hesitate to call and ask to speak to one of the nurses for clinical concerns.  If you have a medical emergency, go to the nearest emergency room or call 911.  A surgeon from [facility] is always on call at the hospital. °[address], [address], [place], [place]  [address] ? [address] [address], , [place]   [address] °[phone] ? [phone] ? FAX [phone] °Web site:   [email or web address] °

## 2013-08-15 ENCOUNTER — Emergency Department: Payer: Self-pay | Admitting: Emergency Medicine

## 2013-08-15 LAB — COMPREHENSIVE METABOLIC PANEL
Albumin: 3.8 g/dL (ref 3.4–5.0)
Alkaline Phosphatase: 80 U/L
Anion Gap: 4 — ABNORMAL LOW (ref 7–16)
BILIRUBIN TOTAL: 0.6 mg/dL (ref 0.2–1.0)
BUN: 7 mg/dL (ref 7–18)
CALCIUM: 9.2 mg/dL (ref 8.5–10.1)
CHLORIDE: 98 mmol/L (ref 98–107)
CO2: 34 mmol/L — AB (ref 21–32)
CREATININE: 1.13 mg/dL (ref 0.60–1.30)
EGFR (Non-African Amer.): >60
GLUCOSE: 112 mg/dL — AB (ref 65–99)
Osmolality: 271 (ref 275–301)
POTASSIUM: 3.6 mmol/L (ref 3.5–5.1)
SGOT(AST): 29 U/L (ref 15–37)
SGPT (ALT): 15 U/L (ref 12–78)
Sodium: 136 mmol/L (ref 136–145)
Total Protein: 7.7 g/dL (ref 6.4–8.2)

## 2013-08-15 LAB — CBC
HCT: 47.6 % (ref 40.0–52.0)
HGB: 15.7 g/dL (ref 13.0–18.0)
MCH: 30.4 pg (ref 26.0–34.0)
MCHC: 32.9 g/dL (ref 32.0–36.0)
MCV: 92 fL (ref 80–100)
PLATELETS: 154 10*3/uL (ref 150–440)
RBC: 5.15 10*6/uL (ref 4.40–5.90)
RDW: 13.3 % (ref 11.5–14.5)
WBC: 14.5 10*3/uL — ABNORMAL HIGH (ref 3.8–10.6)

## 2013-08-15 LAB — LIPASE, BLOOD: Lipase: 71 U/L — ABNORMAL LOW (ref 73–393)

## 2013-08-16 ENCOUNTER — Observation Stay (HOSPITAL_COMMUNITY): Payer: Self-pay | Admitting: Anesthesiology

## 2013-08-16 ENCOUNTER — Encounter (HOSPITAL_COMMUNITY): Payer: MEDICAID | Admitting: Anesthesiology

## 2013-08-16 ENCOUNTER — Inpatient Hospital Stay (HOSPITAL_COMMUNITY)
Admission: AD | Admit: 2013-08-16 | Discharge: 2013-08-20 | DRG: 329 | Disposition: A | Discharge status: Home / Self Care | Payer: Self-pay | Source: Other Acute Inpatient Hospital | Attending: General Surgery | Admitting: General Surgery

## 2013-08-16 ENCOUNTER — Encounter (HOSPITAL_COMMUNITY): Payer: Self-pay | Admitting: General Surgery

## 2013-08-16 ENCOUNTER — Encounter (HOSPITAL_COMMUNITY)
Admission: AD | Disposition: A | Discharge status: Home / Self Care | Payer: Self-pay | Source: Other Acute Inpatient Hospital

## 2013-08-16 DIAGNOSIS — K430 Incisional hernia with obstruction, without gangrene: Secondary | ICD-10-CM

## 2013-08-16 DIAGNOSIS — F17200 Nicotine dependence, unspecified, uncomplicated: Secondary | ICD-10-CM | POA: Diagnosis present

## 2013-08-16 DIAGNOSIS — K460 Unspecified abdominal hernia with obstruction, without gangrene: Secondary | ICD-10-CM | POA: Diagnosis present

## 2013-08-16 DIAGNOSIS — K436 Other and unspecified ventral hernia with obstruction, without gangrene: Principal | ICD-10-CM | POA: Diagnosis present

## 2013-08-16 DIAGNOSIS — K631 Perforation of intestine (nontraumatic): Secondary | ICD-10-CM | POA: Diagnosis present

## 2013-08-16 HISTORY — PX: LAPAROTOMY: SHX154

## 2013-08-16 SURGERY — LAPAROTOMY, EXPLORATORY
Anesthesia: General | Site: Abdomen

## 2013-08-16 MED ORDER — LIDOCAINE HCL (CARDIAC) 20 MG/ML IV SOLN
INTRAVENOUS | Status: DC | PRN
  Administered 2013-08-16: 100 mg via INTRAVENOUS

## 2013-08-16 MED ORDER — 0.9 % SODIUM CHLORIDE (POUR BTL) OPTIME
TOPICAL | Status: DC | PRN
  Administered 2013-08-16: 2000 mL

## 2013-08-16 MED ORDER — ONDANSETRON HCL 4 MG/2ML IJ SOLN
INTRAMUSCULAR | Status: DC | PRN
  Administered 2013-08-16: 4 mg via INTRAVENOUS

## 2013-08-16 MED ORDER — MIDAZOLAM HCL 5 MG/5ML IJ SOLN
INTRAMUSCULAR | Status: DC | PRN
  Administered 2013-08-16: 2 mg via INTRAVENOUS

## 2013-08-16 MED ORDER — ENOXAPARIN SODIUM 40 MG/0.4ML ~~LOC~~ SOLN
40.0000 mg | SUBCUTANEOUS | Status: DC
  Filled 2013-08-16: qty 0.4

## 2013-08-16 MED ORDER — DEXTROSE 5 % IV SOLN
2.0000 g | Freq: Three times a day (TID) | INTRAVENOUS | Status: DC
  Administered 2013-08-16 – 2013-08-20 (×11): 2 g via INTRAVENOUS
  Filled 2013-08-16 (×16): qty 2

## 2013-08-16 MED ORDER — DEXTROSE 5 % IV SOLN: 2.0000 g | INTRAVENOUS | Status: DC

## 2013-08-16 MED ORDER — LACTATED RINGERS IV SOLN
INTRAVENOUS | Status: DC
  Administered 2013-08-16: 50 mL/h via INTRAVENOUS

## 2013-08-16 MED ORDER — OXYCODONE HCL 5 MG/5ML PO SOLN: 5.0000 mg | Freq: Once | ORAL | Status: DC | PRN

## 2013-08-16 MED ORDER — POVIDONE-IODINE 10 % EX OINT
TOPICAL_OINTMENT | CUTANEOUS | Status: AC
  Filled 2013-08-16: qty 28.35

## 2013-08-16 MED ORDER — POVIDONE-IODINE 10 % EX OINT
TOPICAL_OINTMENT | CUTANEOUS | Status: DC | PRN
  Administered 2013-08-16: 1 via TOPICAL

## 2013-08-16 MED ORDER — ENOXAPARIN SODIUM 40 MG/0.4ML ~~LOC~~ SOLN
40.0000 mg | SUBCUTANEOUS | Status: DC
  Administered 2013-08-17 – 2013-08-20 (×4): 40 mg via SUBCUTANEOUS
  Filled 2013-08-16 (×4): qty 0.4

## 2013-08-16 MED ORDER — FENTANYL CITRATE 0.05 MG/ML IJ SOLN
INTRAMUSCULAR | Status: DC | PRN
  Administered 2013-08-16: 250 ug via INTRAVENOUS

## 2013-08-16 MED ORDER — MIDAZOLAM HCL 2 MG/2ML IJ SOLN
INTRAMUSCULAR | Status: AC
  Filled 2013-08-16: qty 2

## 2013-08-16 MED ORDER — HYDROMORPHONE HCL PF 1 MG/ML IJ SOLN: 1.0000 mg | INTRAMUSCULAR | Status: DC | PRN

## 2013-08-16 MED ORDER — OXYCODONE HCL 5 MG PO TABS: 5.0000 mg | ORAL_TABLET | Freq: Once | ORAL | Status: DC | PRN

## 2013-08-16 MED ORDER — ONDANSETRON HCL 4 MG/2ML IJ SOLN
4.0000 mg | Freq: Four times a day (QID) | INTRAMUSCULAR | Status: DC | PRN
  Administered 2013-08-16 – 2013-08-17 (×3): 4 mg via INTRAVENOUS
  Filled 2013-08-16 (×3): qty 2

## 2013-08-16 MED ORDER — PROPOFOL 10 MG/ML IV BOLUS
INTRAVENOUS | Status: DC | PRN
  Administered 2013-08-16: 200 mg via INTRAVENOUS

## 2013-08-16 MED ORDER — ONDANSETRON HCL 4 MG/2ML IJ SOLN
INTRAMUSCULAR | Status: AC
  Filled 2013-08-16: qty 2

## 2013-08-16 MED ORDER — PIPERACILLIN-TAZOBACTAM 3.375 G IVPB: 3.3750 g | INTRAVENOUS | Status: DC

## 2013-08-16 MED ORDER — NEOSTIGMINE METHYLSULFATE 1 MG/ML IJ SOLN
INTRAMUSCULAR | Status: DC | PRN
  Administered 2013-08-16: 4 mg via INTRAVENOUS

## 2013-08-16 MED ORDER — GLYCOPYRROLATE 0.2 MG/ML IJ SOLN
INTRAMUSCULAR | Status: DC | PRN
  Administered 2013-08-16: 0.6 mg via INTRAVENOUS

## 2013-08-16 MED ORDER — ONDANSETRON HCL 4 MG/2ML IJ SOLN: 4.0000 mg | Freq: Four times a day (QID) | INTRAMUSCULAR | Status: DC | PRN

## 2013-08-16 MED ORDER — HYDROMORPHONE HCL PF 1 MG/ML IJ SOLN
0.2500 mg | INTRAMUSCULAR | Status: DC | PRN
  Administered 2013-08-16: 0.5 mg via INTRAVENOUS

## 2013-08-16 MED ORDER — HYDROMORPHONE HCL PF 1 MG/ML IJ SOLN
INTRAMUSCULAR | Status: AC
  Filled 2013-08-16: qty 1

## 2013-08-16 MED ORDER — ROCURONIUM BROMIDE 100 MG/10ML IV SOLN
INTRAVENOUS | Status: DC | PRN
  Administered 2013-08-16: 30 mg via INTRAVENOUS
  Administered 2013-08-16: 5 mg via INTRAVENOUS

## 2013-08-16 MED ORDER — DEXTROSE 5 % IV SOLN
2.0000 g | INTRAVENOUS | Status: AC
  Administered 2013-08-16: 2 g via INTRAVENOUS
  Filled 2013-08-16: qty 2

## 2013-08-16 MED ORDER — SUCCINYLCHOLINE CHLORIDE 20 MG/ML IJ SOLN
INTRAMUSCULAR | Status: DC | PRN
  Administered 2013-08-16: 120 mg via INTRAVENOUS

## 2013-08-16 MED ORDER — PROPOFOL 10 MG/ML IV BOLUS
INTRAVENOUS | Status: AC
  Filled 2013-08-16: qty 20

## 2013-08-16 MED ORDER — FENTANYL CITRATE 0.05 MG/ML IJ SOLN
INTRAMUSCULAR | Status: AC
  Filled 2013-08-16: qty 5

## 2013-08-16 MED ORDER — DEXTROSE-NACL 5-0.9 % IV SOLN
INTRAVENOUS | Status: DC
  Administered 2013-08-16: 100 mL/h via INTRAVENOUS
  Administered 2013-08-17 – 2013-08-18 (×3): via INTRAVENOUS

## 2013-08-16 MED ORDER — HYDROMORPHONE HCL PF 1 MG/ML IJ SOLN
0.5000 mg | INTRAMUSCULAR | Status: DC | PRN
  Administered 2013-08-16: 1 mg via INTRAVENOUS
  Administered 2013-08-16 (×2): 2 mg via INTRAVENOUS
  Administered 2013-08-16: 1 mg via INTRAVENOUS
  Administered 2013-08-17 – 2013-08-19 (×21): 2 mg via INTRAVENOUS
  Filled 2013-08-16 (×5): qty 2
  Filled 2013-08-16 (×2): qty 1
  Filled 2013-08-16 (×18): qty 2

## 2013-08-16 SURGICAL SUPPLY — 52 items
BLADE SURG ROTATE 9660 (MISCELLANEOUS) ×2 IMPLANT
BRR ADH 5X3 SEPRAFILM 6 SHT (MISCELLANEOUS)
CANISTER SUCTION 2500CC (MISCELLANEOUS) ×3 IMPLANT
CHLORAPREP W/TINT 26ML (MISCELLANEOUS) ×1 IMPLANT
COVER MAYO STAND STRL (DRAPES) IMPLANT
COVER SURGICAL LIGHT HANDLE (MISCELLANEOUS) ×3 IMPLANT
DRAPE LAPAROSCOPIC ABDOMINAL (DRAPES) ×3 IMPLANT
DRAPE PROXIMA HALF (DRAPES) IMPLANT
DRAPE UTILITY 15X26 W/TAPE STR (DRAPE) ×6 IMPLANT
DRAPE WARM FLUID 44X44 (DRAPE) ×3 IMPLANT
DRSG OPSITE POSTOP 4X10 (GAUZE/BANDAGES/DRESSINGS) IMPLANT
DRSG OPSITE POSTOP 4X8 (GAUZE/BANDAGES/DRESSINGS) IMPLANT
ELECT BLADE 6.5 EXT (BLADE) IMPLANT
ELECT CAUTERY BLADE 6.4 (BLADE) ×4 IMPLANT
ELECT REM PT RETURN 9FT ADLT (ELECTROSURGICAL) ×3
ELECTRODE REM PT RTRN 9FT ADLT (ELECTROSURGICAL) ×1 IMPLANT
GLOVE BIOGEL PI IND STRL 7.0 (GLOVE) ×3 IMPLANT
GLOVE BIOGEL PI IND STRL 7.5 (GLOVE) IMPLANT
GLOVE BIOGEL PI IND STRL 8 (GLOVE) ×1 IMPLANT
GLOVE BIOGEL PI INDICATOR 7.0 (GLOVE) ×6
GLOVE BIOGEL PI INDICATOR 7.5 (GLOVE) ×2
GLOVE BIOGEL PI INDICATOR 8 (GLOVE) ×2
GLOVE ECLIPSE 7.5 STRL STRAW (GLOVE) ×6 IMPLANT
GLOVE SURG SS PI 7.0 STRL IVOR (GLOVE) ×4 IMPLANT
GOWN STRL REUS W/ TWL LRG LVL3 (GOWN DISPOSABLE) ×3 IMPLANT
GOWN STRL REUS W/TWL LRG LVL3 (GOWN DISPOSABLE) ×12
KIT BASIN OR (CUSTOM PROCEDURE TRAY) ×3 IMPLANT
KIT ROOM TURNOVER OR (KITS) ×3 IMPLANT
LIGASURE IMPACT 36 18CM CVD LR (INSTRUMENTS) IMPLANT
NS IRRIG 1000ML POUR BTL (IV SOLUTION) ×6 IMPLANT
PACK GENERAL/GYN (CUSTOM PROCEDURE TRAY) ×3 IMPLANT
PAD ARMBOARD 7.5X6 YLW CONV (MISCELLANEOUS) ×3 IMPLANT
PENCIL BUTTON HOLSTER BLD 10FT (ELECTRODE) IMPLANT
SEPRAFILM PROCEDURAL PACK 3X5 (MISCELLANEOUS) IMPLANT
SPECIMEN JAR LARGE (MISCELLANEOUS) IMPLANT
SPONGE GAUZE 4X4 12PLY STER LF (GAUZE/BANDAGES/DRESSINGS) ×2 IMPLANT
SPONGE LAP 18X18 X RAY DECT (DISPOSABLE) IMPLANT
STAPLER VISISTAT 35W (STAPLE) ×3 IMPLANT
SUCTION POOLE TIP (SUCTIONS) ×3 IMPLANT
SUT NOVA 1 T20/GS 25DT (SUTURE) IMPLANT
SUT NOVA NAB GS-21 0 18 T12 DT (SUTURE) ×3 IMPLANT
SUT PDS AB 1 TP1 96 (SUTURE) IMPLANT
SUT SILK 2 0 SH CR/8 (SUTURE) ×3 IMPLANT
SUT SILK 2 0 TIES 10X30 (SUTURE) ×3 IMPLANT
SUT SILK 3 0 SH CR/8 (SUTURE) ×3 IMPLANT
SUT SILK 3 0 TIES 10X30 (SUTURE) ×3 IMPLANT
TAPE CLOTH SURG 4X10 WHT LF (GAUZE/BANDAGES/DRESSINGS) ×2 IMPLANT
TOWEL OR 17X26 10 PK STRL BLUE (TOWEL DISPOSABLE) ×3 IMPLANT
TRAY FOLEY CATH 16FRSI W/METER (SET/KITS/TRAYS/PACK) IMPLANT
TUBE CONNECTING 12'X1/4 (SUCTIONS)
TUBE CONNECTING 12X1/4 (SUCTIONS) IMPLANT
YANKAUER SUCT BULB TIP NO VENT (SUCTIONS) IMPLANT

## 2013-08-16 NOTE — H&P (Signed)
The patient has incarcerated bowel at the trocar site in the left lower quadrant.  To the OR ASAP.  Possible bowel resection.  Marta LamasJames O. Gae BonWyatt, III, MD, FACS [phone]--pager [phone]--office [facility]

## 2013-08-16 NOTE — Op Note (Signed)
OPERATIVE REPORT  DATE OF OPERATION: 08/16/2013  PATIENT:  Mitchell [name]  28 y.o. male  PRE-OPERATIVE DIAGNOSIS:  Incarcerated Abdomen Wall Hernia with small bowel obstruction  POST-OPERATIVE DIAGNOSIS:  Incarcerated Abdomen Wall Hernia with small bowl perforation  PROCEDURE:  Procedure(s): INCARCERATED ABDOMINAL WALL HERNIA REPAIR, enterrhorhaphy  SURGEON:  Surgeon(s): Cherylynn RidgesJames O Lysette Lindenbaum, MD  ASSISTANT: None  ANESTHESIA:   general  EBL: <20 ml  BLOOD ADMINISTERED: none  DRAINS: Nasogastric Tube   SPECIMEN:  No Specimen  COUNTS CORRECT:  YES  PROCEDURE DETAILS: The patient was taken to the operating room and placed on table in supine position. After an adequate general endotracheal anesthetic was administered he was prepped and draped in usual sterile manner exposing his entire abdomen.  A proper time out was performed identifying the patient and the procedure to be performed. We made our initial incision transversely on top of the previous left lower quadrant incision made for the laparoscopic appendectomy.  We dissected down into the subcutaneous tissue where loop of small bowel was noted. There was also some darkened fluid in that area. Upon further opening the sac down a small perforation in the bowel was noted however the entire loop of bowel was viable. We repaired this loop of perforation using interrupted silk stitches of 3-0 silk. We then had a large the fascial slightly nor to get the bowel passed back into the peritoneal cavity. Once this was done we irrigated with small amount of saline in the wound and we repaired using interrupted simple stitches of 0 Novafil. The irrigated subcutaneous tissue. Although those a small amount of drainage in the subcutaneous tissue of this is irrigated freely and none of it appeared to be feculent bowel contents. Therefore we closed the skin using stainless steel staples. All counts were correct. A sterile dressing was applied.  PATIENT  DISPOSITION:  PACU - hemodynamically stable.   [name], [name] 4/6/201512:41 PM

## 2013-08-16 NOTE — Transfer of Care (Signed)
Immediate Anesthesia Transfer of Care Note  Patient: Mitchell [name]  Procedure(s) Performed: Procedure(s): INCARCERATED ABDOMINAL WALL HERNIA REPAIR (N/A)  Patient Location: PACU  Anesthesia Type:General  Level of Consciousness: awake, alert  and oriented  Airway & Oxygen Therapy: Patient Spontanous Breathing and Patient connected to nasal cannula oxygen  Post-op Assessment: Report given to PACU RN and Post -op Vital signs reviewed and stable  Post vital signs: Reviewed and stable  Complications: No apparent anesthesia complications

## 2013-08-16 NOTE — H&P (Signed)
  Chief Complaint: abdominal pain, vomiting HPI: Mitchell [name] is a healthy 28 year old male transferred from [place] with an obstruction, ventral hernia following a laparoscopic appendectomy by Dr. Elwyn LadeB Thompson on 08/13/13.  He was discharged home on the 4th.  The patient states he noticed a bulge on Friday and has been vomiting since.  No flatus or BM since Friday. Last oral intake was on Friday.  Denies fever, chills or sweats.  [facility] was [facility] ED, a CT of abdomen and pelvis showed a small bowel obstruction, ventral hernia over the left lower quadrant with small bowel extending into hernia defect.      History reviewed. No pertinent past medical history.  History reviewed. No pertinent past surgical history.  History reviewed. No pertinent family history. Social History:  reports that he has been smoking.  He does not have any smokeless tobacco history on file. His alcohol and drug histories are not on file.  Allergies: No Known Allergies  Medications Prior to Admission  Medication Sig Dispense Refill  . oxyCODONE-acetaminophen (PERCOCET/ROXICET) 5-325 MG per tablet Take 1-2 tablets by mouth every 4 (four) hours as needed for moderate pain.  30 tablet  0    No results found for this or any previous visit (from the past 48 hour(s)). No results found.  Review of Systems  All other systems reviewed and are negative.    Blood pressure 144/82, pulse 72, temperature 98.7 F (37.1 C), temperature source Oral, resp. rate 16, SpO2 98.00%. Physical Exam  Constitutional: He is oriented to person, place, and time. He appears well-developed and well-nourished. No distress.  HENT:  Head: Normocephalic and atraumatic.  Neck: Normal range of motion. Neck supple.  Cardiovascular: Normal rate, regular rhythm, normal heart sounds and intact distal pulses.  Exam reveals no gallop and no friction rub.   No murmur heard. Respiratory: Effort normal and breath sounds normal. No respiratory  distress. He has no wheezes. He has no rales. He exhibits no tenderness.  GI: Bowel sounds are normal. There is no rebound and no guarding.  LLQ defect, erythema and tenderness.  Other incisions are healing well.  Musculoskeletal: Normal range of motion. He exhibits no edema.  Neurological: He is alert and oriented to person, place, and time.  Skin: Skin is warm and dry. No rash noted. He is not diaphoretic. No erythema. No pallor.  Psychiatric: He has a normal mood and affect. His behavior is normal. Judgment and thought content normal.     Assessment/Plan S/p laparoscopic appendectomy--08/13/13 Incarcerated hernia with obstruction -NGT for decompression -NPO -IVF -pain control -proceed with hernia repair today -SCDs/lovenox(post op) -cefoxitin pre op   Mitchell [name] ANP-BC Pager [phone] 08/16/2013, 10:36 AM

## 2013-08-16 NOTE — Anesthesia Preprocedure Evaluation (Signed)
Anesthesia Evaluation  Patient identified by MRN, date of birth, ID band Patient awake    Reviewed: Allergy & Precautions, H&P , NPO status , Patient's Chart, lab work & pertinent test results  Airway Mallampati: II  Neck ROM: full    Dental   Pulmonary Current Smoker,          Cardiovascular negative cardio ROS      Neuro/Psych    GI/Hepatic Incarcerated abdominal wall hernia   Endo/Other    Renal/GU      Musculoskeletal   Abdominal   Peds  Hematology   Anesthesia Other Findings   Reproductive/Obstetrics                           Anesthesia Physical Anesthesia Plan  ASA: II  Anesthesia Plan: General   Post-op Pain Management:    Induction: Intravenous, Rapid sequence and Cricoid pressure planned  Airway Management Planned: Oral ETT  Additional Equipment:   Intra-op Plan:   Post-operative Plan: Extubation in OR  Informed Consent: I have reviewed the patients History and Physical, chart, labs and discussed the procedure including the risks, benefits and alternatives for the proposed anesthesia with the patient or authorized representative who has indicated his/her understanding and acceptance.     Plan Discussed with: CRNA, Anesthesiologist and Surgeon  Anesthesia Plan Comments:         Anesthesia Quick Evaluation

## 2013-08-17 ENCOUNTER — Observation Stay (HOSPITAL_COMMUNITY): Payer: Self-pay

## 2013-08-17 MED ORDER — KETOROLAC TROMETHAMINE 30 MG/ML IJ SOLN
30.0000 mg | Freq: Once | INTRAMUSCULAR | Status: AC
  Administered 2013-08-17: 30 mg via INTRAVENOUS
  Filled 2013-08-17: qty 1

## 2013-08-17 MED ORDER — KETOROLAC TROMETHAMINE 15 MG/ML IJ SOLN
15.0000 mg | Freq: Four times a day (QID) | INTRAMUSCULAR | Status: DC | PRN
  Administered 2013-08-18: 30 mg via INTRAVENOUS
  Filled 2013-08-17: qty 1

## 2013-08-17 MED ORDER — DIPHENHYDRAMINE HCL 50 MG/ML IJ SOLN
12.5000 mg | Freq: Four times a day (QID) | INTRAMUSCULAR | Status: DC | PRN
  Administered 2013-08-17 – 2013-08-20 (×10): 25 mg via INTRAVENOUS
  Filled 2013-08-17 (×10): qty 1

## 2013-08-17 NOTE — Progress Notes (Signed)
UR completed. Patient changed to inpatient- requiring IVF @ 100cc/hr, IV antibiotics and IV pain medication

## 2013-08-17 NOTE — Progress Notes (Signed)
NG is not in so I took it out he has a few BS.  He has passed a little gas, I am going  To try and get him up and see how he does.  i ask nurse to replace NG if he has nausea, or more distended.   Recheck film in AM.

## 2013-08-17 NOTE — Progress Notes (Signed)
Patient ID: Mitchell MessickBrian D [name], male   DOB: Apr 30, 1986, 28 y.o.   MRN: [id number]   Subjective: No flatus.  +nausea since NGT clamped and taking a walk.  No output recorded, in cannister is clear liquid, no bilious output.    Objective:  Vital signs:  Filed Vitals:   08/16/13 1402 08/16/13 1422 08/16/13 2028 08/17/13 0550  BP: 134/105 129/69 148/93 143/84  Pulse: 73 67 67 74  Temp:  97.6 F (36.4 C) 98.6 F (37 C) 98.6 F (37 C)  TempSrc:  Tympanic    Resp: 17 16 18 18   Height:      Weight:      SpO2: 94% 97% 99% 98%    Last BM Date: 08/13/13  Intake/Output   Yesterday:  04/06 0701 - 04/07 0700 In: 700 [I.V.:700] Out: -  This shift:    I/O last 3 completed shifts: In: 700 [I.V.:700] Out: -     Physical Exam: General: Pt awake/alert/oriented x3 in no acute distress Abdomen: Soft.  Distended. TTP to LLQ.  Dressing c/d/i.  No evidence of peritonitis.  No incarcerated hernias. Ext:  SCDs BLE.  No mjr edema.  No cyanosis Skin: No petechiae / purpura   Problem List:   Active Problems:   Hernia with obstruction    Results:   Labs: No results found for this or any previous visit (from the past 48 hour(s)).  Imaging / Studies: No results found.  Medications / Allergies: per chart  Antibiotics: Anti-infectives   Start     Dose/Rate Route Frequency Ordered Stop   08/16/13 1900  cefOXitin (MEFOXIN) 2 g in dextrose 5 % 50 mL IVPB     2 g 100 mL/hr over 30 Minutes Intravenous 3 times per day 08/16/13 1430     08/16/13 1145  cefOXitin (MEFOXIN) 2 g in dextrose 5 % 50 mL IVPB    Comments:  Pharmacy may adjust dosing strength, interval, or rate of medication as needed for optimal therapy for the patient Send with patient on call to the OR.  Anesthesia to complete antibiotic administration <2660min prior to incision [facility].   2 g 100 mL/hr over 30 Minutes Intravenous On call to O.R. 08/16/13 1132 08/16/13 1147   08/16/13 1045  piperacillin-tazobactam  (ZOSYN) IVPB 3.375 g  Status:  Discontinued     3.375 g 12.5 mL/hr over 240 Minutes Intravenous On call to O.R. 08/16/13 1034 08/16/13 1038   08/16/13 1045  cefOXitin (MEFOXIN) 2 g in dextrose 5 % 50 mL IVPB  Status:  Discontinued    Comments:  Pharmacy may adjust dosing strength, interval, or rate of medication as needed for optimal therapy for the patient Send with patient on call to the OR.  Anesthesia to complete antibiotic administration <5460min prior to incision [facility].   2 g 100 mL/hr over 30 Minutes Intravenous On call to O.R. 08/16/13 1038 08/16/13 1044      Assessment/Plan S/p laparoscopic appendectomy--08/13/13  Incarcerated abdominal wall hernia with small bowel perforation s/p repair Dr. Lindie SpruceWyatt 08/16/13 [facility] Add toradol for pain control SCD/lovenox Cefoxitin 4/6---> IV hydration  Ashok NorrisEmina Seattle Dalporto, Shore Medical CenterNP-BC [facility] Pager [phone] Office [phone]  08/17/2013 8:50 AM

## 2013-08-17 NOTE — Progress Notes (Signed)
Patient called RN to the room and reported bright red blood draining from NGT. There was some blood in the tube approximately 5-10 cc. Emina PA-C was paged and made aware. New orders received and will continue to monitor.

## 2013-08-17 NOTE — Progress Notes (Signed)
Patient wants to eat.  Still not BM or flatus.  Quiet belly.  Keep NGT at least until tomorrow.  Marta LamasJames O. Gae BonWyatt, III, MD, FACS [phone]--pager [phone]--office [facility]

## 2013-08-17 NOTE — Anesthesia Postprocedure Evaluation (Signed)
Anesthesia Post Note  Patient: Mitchell [name]  Procedure(s) Performed: Procedure(s) (LRB): INCARCERATED ABDOMINAL WALL HERNIA REPAIR (N/A)  Anesthesia type: General  Patient location: PACU  Post pain: Pain level controlled and Adequate analgesia  Post assessment: Post-op Vital signs reviewed, Patient's Cardiovascular Status Stable, Respiratory Function Stable, Patent Airway and Pain level controlled  Last Vitals:  Filed Vitals:   08/17/13 0550  BP: 143/84  Pulse: 74  Temp: 37 C  Resp: 18    Post vital signs: Reviewed and stable  Level of consciousness: awake, alert  and oriented  Complications: No apparent anesthesia complications

## 2013-08-18 ENCOUNTER — Inpatient Hospital Stay (HOSPITAL_COMMUNITY): Payer: Self-pay

## 2013-08-18 ENCOUNTER — Encounter (HOSPITAL_COMMUNITY): Payer: Self-pay | Admitting: General Surgery

## 2013-08-18 ENCOUNTER — Telehealth (HOSPITAL_COMMUNITY): Payer: Self-pay

## 2013-08-18 LAB — COMPREHENSIVE METABOLIC PANEL
ALT: 9 U/L (ref 0–53)
AST: 31 U/L (ref 0–37)
Albumin: 3.5 g/dL (ref 3.5–5.2)
Alkaline Phosphatase: 60 U/L (ref 39–117)
BUN: 5 mg/dL — ABNORMAL LOW (ref 6–23)
CO2: 27 mEq/L (ref 19–32)
Calcium: 9.2 mg/dL (ref 8.4–10.5)
Chloride: 100 mEq/L (ref 96–112)
Creatinine, Ser: 0.88 mg/dL (ref 0.50–1.35)
GFR calc Af Amer: >90 mL/min (ref >90)
GFR calc non Af Amer: >90 mL/min (ref >90)
Glucose, Bld: 83 mg/dL (ref 70–99)
Potassium: 4.4 mEq/L (ref 3.7–5.3)
Sodium: 141 mEq/L (ref 137–147)
Total Bilirubin: 0.7 mg/dL (ref 0.3–1.2)
Total Protein: 6.6 g/dL (ref 6.0–8.3)

## 2013-08-18 LAB — CBC
HCT: 37 % — ABNORMAL LOW (ref 39.0–52.0)
Hemoglobin: 13.1 g/dL (ref 13.0–17.0)
MCH: 31.1 pg (ref 26.0–34.0)
MCHC: 35.4 g/dL (ref 30.0–36.0)
MCV: 87.9 fL (ref 78.0–100.0)
PLATELETS: 157 10*3/uL (ref 150–400)
RBC: 4.21 MIL/uL — AB (ref 4.22–5.81)
RDW: 12.2 % (ref 11.5–15.5)
WBC: 8.6 10*3/uL (ref 4.0–10.5)

## 2013-08-18 NOTE — Progress Notes (Signed)
Patient ID: Mitchell [name], male   DOB: 01-21-1986, 28 y.o.   MRN: [id number]  Subjective: NGT out.  No n/v.  Passing flatus.  Ambulating in hallways.  Voiding.  Pain adequately controlled.  Objective:  Vital signs:  Filed Vitals:   08/17/13 0550 08/17/13 1322 08/17/13 2143 08/18/13 0714  BP: 143/84 134/65 153/80 131/89  Pulse: 74 87 80 76  Temp: 98.6 F (37 C) 97.9 F (36.6 C) 98.4 F (36.9 C) 98.5 F (36.9 C)  TempSrc:   Oral Oral  Resp: 18 16 18 18   Height:      Weight:      SpO2: 98%  93% 96%    Last BM Date: 08/13/13  Intake/Output   Yesterday:  04/07 0701 - 04/08 0700 In: 2741.7 [P.O.:20; I.V.:2396.7; NG/GT:75; IV Piggyback:250] Out: -  This shift:    I/O last 3 completed shifts: In: 2741.7 [P.O.:20; I.V.:2396.7; NG/GT:75; IV Piggyback:250] Out: -     Physical Exam:  General: Pt awake/alert/oriented x3 in no acute distress  Abdomen: Soft. Mildly distended. TTP to LLQ. Staples to LLQ, dressing removed, there is some erythema noted around the incision site, monitor site for developing cellulitis.   No incarcerated hernias.  Ext: SCDs BLE. No mjr edema. No cyanosis  Skin: No petechiae / purpura  Problem List:   Active Problems:   Hernia with obstruction    Results:   Labs: No results found for this or any previous visit (from the past 48 hour(s)).  Imaging / Studies: Dg Abd 1 View  08/18/2013   CLINICAL DATA:  Postoperative ileus.  EXAM: ABDOMEN - 1 VIEW  COMPARISON:  One-view abdomen 08/17/2012  FINDINGS: Previously seen small bowel dilation is improved. There is slight progression of contrast within the ascending colon. Gas-filled loops of colon are again noted. There is no definite free air.  IMPRESSION: 1. Decreased distention of small bowel compatible with resolving ileus.   Electronically Signed   By: Gennette Pac M.D.   On: 08/18/2013 07:48   Dg Abd Portable 1v  08/17/2013   CLINICAL DATA:  NGT placement  EXAM: PORTABLE ABDOMEN - 1 VIEW   COMPARISON:  08/15/2013  FINDINGS: Scattered large and small bowel gas is noted. Mild small bowel dilatation is seen. Changes consistent with recent hernia repair in the left lower quadrant are noted. Contrast material is noted within the right colon from recent CT examination. No nasogastric catheter is noted and likely is coiled within the upper esophagus. Clinical correlation is recommended.  IMPRESSION: No NG catheter identified.  Scattered large and small bowel gas with mild persistent small bowel dilatation.   Electronically Signed   By: Alcide Clever M.D.   On: 08/17/2013 15:45    Medications / Allergies: per chart  Antibiotics: Anti-infectives   Start     Dose/Rate Route Frequency Ordered Stop   08/16/13 1900  cefOXitin (MEFOXIN) 2 g in dextrose 5 % 50 mL IVPB     2 g 100 mL/hr over 30 Minutes Intravenous 3 times per day 08/16/13 1430     08/16/13 1145  cefOXitin (MEFOXIN) 2 g in dextrose 5 % 50 mL IVPB    Comments:  Pharmacy may adjust dosing strength, interval, or rate of medication as needed for optimal therapy for the patient Send with patient on call to the OR.  Anesthesia to complete antibiotic administration <50min prior to incision per [facility].   2 g 100 mL/hr over 30 Minutes Intravenous On call to O.R. 08/16/13 1132  08/16/13 1147   08/16/13 1045  piperacillin-tazobactam (ZOSYN) IVPB 3.375 g  Status:  Discontinued     3.375 g 12.5 mL/hr over 240 Minutes Intravenous On call to O.R. 08/16/13 1034 08/16/13 1038   08/16/13 1045  cefOXitin (MEFOXIN) 2 g in dextrose 5 % 50 mL IVPB  Status:  Discontinued    Comments:  Pharmacy may adjust dosing strength, interval, or rate of medication as needed for optimal therapy for the patient Send with patient on call to the OR.  Anesthesia to complete antibiotic administration <4160min prior to incision [facility].   2 g 100 mL/hr over 30 Minutes Intravenous On call to O.R. 08/16/13 1038 08/16/13 1044      Assessment/Plan  S/p  laparoscopic appendectomy--08/13/13  Incarcerated abdominal wall hernia with small bowel perforation s/p repair Dr. Lindie SpruceWyatt 08/16/13  POD#2 Clear liquid diet, advance slowly Mobilize  IS  Dilaudid and toradol, add oral pain meds SCD/lovenox  Cefoxitin 4/6--->  IV hydration  Ashok NorrisEmina Coley Kulikowski, [facility] HospitalNP-BC [facility] Pager [phone] Office [phone]  08/18/2013 8:06 AM

## 2013-08-18 NOTE — Progress Notes (Signed)
Patient off the floor, but by nurse's report, he his having flatus, no bowel movement.  Will check wound tomorrow.  Marta LamasJames O. Gae BonWyatt, III, MD, FACS [phone]--pager [phone]--office [facility]

## 2013-08-18 NOTE — Telephone Encounter (Signed)
Pt had issue resolved by the time I spoke with him.

## 2013-08-19 LAB — BASIC METABOLIC PANEL
BUN: 5 mg/dL — AB (ref 6–23)
CALCIUM: 9.1 mg/dL (ref 8.4–10.5)
CO2: 29 mEq/L (ref 19–32)
Chloride: 97 mEq/L (ref 96–112)
Creatinine, Ser: 0.93 mg/dL (ref 0.50–1.35)
GFR calc Af Amer: >90 mL/min (ref >90)
GFR calc non Af Amer: >90 mL/min (ref >90)
GLUCOSE: 78 mg/dL (ref 70–99)
Potassium: 4.4 mEq/L (ref 3.7–5.3)
SODIUM: 137 meq/L (ref 137–147)

## 2013-08-19 LAB — CBC
HEMATOCRIT: 35.6 % — AB (ref 39.0–52.0)
HEMOGLOBIN: 12.8 g/dL — AB (ref 13.0–17.0)
MCH: 31.3 pg (ref 26.0–34.0)
MCHC: 36 g/dL (ref 30.0–36.0)
MCV: 87 fL (ref 78.0–100.0)
Platelets: 162 10*3/uL (ref 150–400)
RBC: 4.09 MIL/uL — ABNORMAL LOW (ref 4.22–5.81)
RDW: 12 % (ref 11.5–15.5)
WBC: 8.3 10*3/uL (ref 4.0–10.5)

## 2013-08-19 MED ORDER — OXYCODONE-ACETAMINOPHEN 5-325 MG PO TABS
1.0000 | ORAL_TABLET | ORAL | Status: DC | PRN
  Administered 2013-08-19 – 2013-08-20 (×5): 2 via ORAL
  Filled 2013-08-19 (×5): qty 2

## 2013-08-19 MED ORDER — BISACODYL 10 MG RE SUPP
10.0000 mg | Freq: Once | RECTAL | Status: AC
  Administered 2013-08-19: 10 mg via RECTAL
  Filled 2013-08-19: qty 1

## 2013-08-19 MED ORDER — SODIUM CHLORIDE 0.9 % IJ SOLN: 3.0000 mL | INTRAMUSCULAR | Status: DC | PRN

## 2013-08-19 MED ORDER — SODIUM CHLORIDE 0.9 % IJ SOLN
3.0000 mL | Freq: Two times a day (BID) | INTRAMUSCULAR | Status: DC
  Administered 2013-08-19 – 2013-08-20 (×3): 3 mL via INTRAVENOUS

## 2013-08-19 MED ORDER — POLYETHYLENE GLYCOL 3350 17 G PO PACK
17.0000 g | PACK | Freq: Every day | ORAL | Status: DC
  Administered 2013-08-19 – 2013-08-20 (×2): 17 g via ORAL
  Filled 2013-08-19 (×2): qty 1

## 2013-08-19 MED ORDER — IBUPROFEN 600 MG PO TABS
600.0000 mg | ORAL_TABLET | Freq: Three times a day (TID) | ORAL | Status: DC
  Administered 2013-08-19 – 2013-08-20 (×4): 600 mg via ORAL
  Filled 2013-08-19 (×6): qty 1

## 2013-08-19 NOTE — Progress Notes (Signed)
Pt continues to complain of left lower abdominal pain. Is requiring IV Dilaudid every two to three hours. He is also concerned about his incision sites. States that the incision on the right upper quadrant has a "hard knot" just like the LLQ had prior to the hernia/obstruction. There is some bruising on the RUQ site with hardness around the incision. No redness or protrusion noted. Pt is drinking coffee and walking around unit trying to relieve his abdominal gas and to have a BM so he can go home.

## 2013-08-19 NOTE — Progress Notes (Signed)
Patient ID: Mitchell [name], male   DOB: 1985/08/14, 28 y.o.   MRN: [id number]  Subjective: Passing flatus, tolerating clears.  No n/v.  No dysuria.   Objective:  Vital signs:  Filed Vitals:   08/16/13 0947 08/18/13 0714 08/18/13 1425 08/18/13 2114  BP:  131/89 152/72 118/74  Pulse:  76 85 87  Temp:  98.5 F (36.9 C) 97.9 F (36.6 C) 98.3 F (36.8 C)  TempSrc:  Oral Oral Oral  Resp:  _0 Height: 5' 10" (1.778 m)     Weight: 173 lb (78.472 kg)     SpO2:  96% 98% 98%    Last BM Date: 08/13/13  Intake/Output   Yesterday:  04/08 0701 - 04/09 0700 In: 2280 [P.O.:1080; I.V.:1200] Out: 402 [Urine:402] This shift:    I/O last 3 completed shifts: In: 3746.7 [P.O.:1100; I.V.:2396.7; IV Piggyback:250] Out: 402 [Urine:402]     Physical Exam:  General: Pt awake/alert/oriented x3 in no acute distress  Abdomen: Soft. Mildly distended. TTP to LLQ. Staples to LLQ, there is some erythema noted around the incision site, monitor site for developing cellulitis. No incarcerated hernias.  Ext: SCDs BLE. No mjr edema. No cyanosis  Skin: No petechiae / purpura   Problem List:   Active Problems:   Hernia with obstruction    Results:   Labs: Results for orders placed during the hospital encounter of 08/16/13 (from the past 48 hour(s))  CBC     Status: Abnormal   Collection Time    08/18/13  7:00 AM      Result Value Ref Range   WBC 8.6  4.0 - 10.5 K/uL   RBC 4.21 (*) 4.22 - 5.81 MIL/uL   Hemoglobin 13.1  13.0 - 17.0 g/dL   HCT 37.0 (*) 39.0 - 52.0 %   MCV 87.9  78.0 - 100.0 fL   MCH 31.1  26.0 - 34.0 pg   MCHC 35.4  30.0 - 36.0 g/dL   RDW 12.2  11.5 - 15.5 %   Platelets 157  150 - 400 K/uL  COMPREHENSIVE METABOLIC PANEL     Status: Abnormal   Collection Time    08/18/13  7:00 AM      Result Value Ref Range   Sodium 141  137 - 147 mEq/L   Potassium 4.4  3.7 - 5.3 mEq/L   Chloride 100  96 - 112 mEq/L   CO2 27  19 - 32 mEq/L   Glucose, Bld 83  70 - 99 mg/dL   BUN  5 (*) 6 - 23 mg/dL   Creatinine, Ser 0.88  0.50 - 1.35 mg/dL   Calcium 9.2  8.4 - 10.5 mg/dL   Total Protein 6.6  6.0 - 8.3 g/dL   Albumin 3.5  3.5 - 5.2 g/dL   AST 31  0 - 37 U/L   ALT 9  0 - 53 U/L   Alkaline Phosphatase 60  39 - 117 U/L   Total Bilirubin 0.7  0.3 - 1.2 mg/dL   GFR calc non Af Amer >90  >90 mL/min   GFR calc Af Amer >90  >90 mL/min   Comment: (NOTE)     The eGFR has been calculated using the CKD EPI equation.     This calculation has not been validated in all clinical situations.     eGFR's persistently <90 mL/min signify possible Chronic Kidney     Disease.  CBC     Status: Abnormal   Collection Time  08/19/13  6:25 AM      Result Value Ref Range   WBC 8.3  4.0 - 10.5 K/uL   RBC 4.09 (*) 4.22 - 5.81 MIL/uL   Hemoglobin 12.8 (*) 13.0 - 17.0 g/dL   HCT 35.6 (*) 39.0 - 52.0 %   MCV 87.0  78.0 - 100.0 fL   MCH 31.3  26.0 - 34.0 pg   MCHC 36.0  30.0 - 36.0 g/dL   RDW 12.0  11.5 - 15.5 %   Platelets 162  150 - 400 K/uL  BASIC METABOLIC PANEL     Status: Abnormal   Collection Time    08/19/13  6:25 AM      Result Value Ref Range   Sodium 137  137 - 147 mEq/L   Potassium 4.4  3.7 - 5.3 mEq/L   Chloride 97  96 - 112 mEq/L   CO2 29  19 - 32 mEq/L   Glucose, Bld 78  70 - 99 mg/dL   BUN 5 (*) 6 - 23 mg/dL   Creatinine, Ser 0.93  0.50 - 1.35 mg/dL   Calcium 9.1  8.4 - 10.5 mg/dL   GFR calc non Af Amer >90  >90 mL/min   GFR calc Af Amer >90  >90 mL/min   Comment: (NOTE)     The eGFR has been calculated using the CKD EPI equation.     This calculation has not been validated in all clinical situations.     eGFR's persistently <90 mL/min signify possible Chronic Kidney     Disease.    Imaging / Studies: Dg Abd 1 View  08/18/2013   CLINICAL DATA:  Postoperative ileus.  EXAM: ABDOMEN - 1 VIEW  COMPARISON:  One-view abdomen 08/17/2012  FINDINGS: Previously seen small bowel dilation is improved. There is slight progression of contrast within the ascending colon.  Gas-filled loops of colon are again noted. There is no definite free air.  IMPRESSION: 1. Decreased distention of small bowel compatible with resolving ileus.   Electronically Signed   By: Lawrence Santiago M.D.   On: 08/18/2013 07:48   Dg Abd Portable 1v  08/17/2013   CLINICAL DATA:  NGT placement  EXAM: PORTABLE ABDOMEN - 1 VIEW  COMPARISON:  08/15/2013  FINDINGS: Scattered large and small bowel gas is noted. Mild small bowel dilatation is seen. Changes consistent with recent hernia repair in the left lower quadrant are noted. Contrast material is noted within the right colon from recent CT examination. No nasogastric catheter is noted and likely is coiled within the upper esophagus. Clinical correlation is recommended.  IMPRESSION: No NG catheter identified.  Scattered large and small bowel gas with mild persistent small bowel dilatation.   Electronically Signed   By: Inez Catalina M.D.   On: 08/17/2013 15:45    Medications / Allergies: per chart  Antibiotics: Anti-infectives   Start     Dose/Rate Route Frequency Ordered Stop   08/16/13 1900  cefOXitin (MEFOXIN) 2 g in dextrose 5 % 50 mL IVPB     2 g 100 mL/hr over 30 Minutes Intravenous 3 times per day 08/16/13 1430     08/16/13 1145  cefOXitin (MEFOXIN) 2 g in dextrose 5 % 50 mL IVPB    Comments:  Pharmacy may adjust dosing strength, interval, or rate of medication as needed for optimal therapy for the patient Send with patient on call to the OR.  Anesthesia to complete antibiotic administration [facility]34mn [facility]  2 g 100 mL/hr over 30 Minutes Intravenous On call to O.R. 08/16/13 1132 08/16/13 1147   08/16/13 1045  piperacillin-tazobactam (ZOSYN) IVPB 3.375 g  Status:  Discontinued     3.375 g 12.5 mL/hr over 240 Minutes Intravenous On call to O.R. 08/16/13 1034 08/16/13 1038   08/16/13 1045  cefOXitin (MEFOXIN) 2 g in dextrose 5 % 50 mL IVPB  Status:  Discontinued    Comments:  Pharmacy may adjust dosing strength,  interval, or rate of medication as needed for optimal therapy for the patient Send with patient on call to the OR.  Anesthesia to complete antibiotic administration [facility]38mn [facility]   2 g 100 mL/hr over 30 Minutes Intravenous On call to O.R. 08/16/13 1038 08/16/13 1044     Assessment/Plan  S/p laparoscopic appendectomy--08/13/13  Incarcerated abdominal wall hernia with small bowel perforation s/p repair Dr. WHulen Skains4/6/15  POD#3  Advance to full liquid diet Dulcolax suppository x1, miralax Mobilize  IS  Dilaudid and toradol, add oral pain meds  SCD/lovenox  Cefoxitin 4/6--->  SVivien Presto [facility] Pager [phone] [phone] 08/19/2013  9:19 AM

## 2013-08-19 NOTE — Progress Notes (Signed)
Doing fine and may go home soon.  Marta LamasJames O. Gae BonWyatt, III, MD, FACS [phone]--pager [phone]--office [facility]

## 2013-08-20 MED ORDER — POLYETHYLENE GLYCOL 3350 17 G PO PACK: 17.0000 g | PACK | Freq: Every day | ORAL | Status: DC

## 2013-08-20 MED ORDER — IBUPROFEN 600 MG PO TABS: 600.0000 mg | ORAL_TABLET | Freq: Three times a day (TID) | ORAL | Status: DC

## 2013-08-20 MED ORDER — OXYCODONE-ACETAMINOPHEN 5-325 MG PO TABS: 1.0000 | ORAL_TABLET | ORAL | Status: DC | PRN

## 2013-08-20 NOTE — Progress Notes (Signed)
I have seen and examined the pt and agree with NP-Riebock's progress note. Home today F/u in 3d for staples removal

## 2013-08-20 NOTE — Progress Notes (Signed)
Patient ID: Mitchell [name], male   DOB: Jan 21, 1986, 28 y.o.   MRN: [id number]   Subjective: Had several BMs yesterday.  Pain much better controlled.  Voiding.  Ambulating.    Objective:  Vital signs:  Filed Vitals:   08/18/13 1425 08/18/13 2114 08/19/13 1442 08/19/13 2325  BP: 152/72 118/74 145/99 131/85  Pulse: 85 87 76 82  Temp: 97.9 F (36.6 C) 98.3 F (36.8 C) 98.3 F (36.8 C) 98.4 F (36.9 C)  TempSrc: Oral Oral Oral Oral  Resp: '18 18 18 18  ' Height:      Weight:      SpO2: 98% 98% 100% 100%    Last BM Date: 08/19/13  Intake/Output   Yesterday:  04/09 0701 - 04/10 0700 In: 533 [P.O.:480; I.V.:3; IV Piggyback:50] Out: -  This shift:    I/O last 3 completed shifts: In: 2453 [P.O.:1200; I.V.:1203; IV Piggyback:50] Out: 402 [Urine:402]    Physical Exam:  General: Pt awake/alert/oriented x3 in no acute distress  Abdomen: Soft. Non-distended.  appropriately tender. llq staples, intact, no drainage, no erythema.  Ext: SCDs BLE. No mjr edema. No cyanosis  Skin: No petechiae / purpura  Problem List:   Active Problems:   Hernia with obstruction    Results:   Labs: Results for orders placed during the hospital encounter of 08/16/13 (from the past 48 hour(s))  CBC     Status: Abnormal   Collection Time    08/19/13  6:25 AM      Result Value Ref Range   WBC 8.3  4.0 - 10.5 K/uL   RBC 4.09 (*) 4.22 - 5.81 MIL/uL   Hemoglobin 12.8 (*) 13.0 - 17.0 g/dL   HCT 35.6 (*) 39.0 - 52.0 %   MCV 87.0  78.0 - 100.0 fL   MCH 31.3  26.0 - 34.0 pg   MCHC 36.0  30.0 - 36.0 g/dL   RDW 12.0  11.5 - 15.5 %   Platelets 162  150 - 400 K/uL  BASIC METABOLIC PANEL     Status: Abnormal   Collection Time    08/19/13  6:25 AM      Result Value Ref Range   Sodium 137  137 - 147 mEq/L   Potassium 4.4  3.7 - 5.3 mEq/L   Chloride 97  96 - 112 mEq/L   CO2 29  19 - 32 mEq/L   Glucose, Bld 78  70 - 99 mg/dL   BUN 5 (*) 6 - 23 mg/dL   Creatinine, Ser 0.93  0.50 - 1.35 mg/dL    Calcium 9.1  8.4 - 10.5 mg/dL   GFR calc non Af Amer >90  >90 mL/min   GFR calc Af Amer >90  >90 mL/min   Comment: (NOTE)     The eGFR has been calculated using the CKD EPI equation.     This calculation has not been validated in all clinical situations.     eGFR's persistently <90 mL/min signify possible Chronic Kidney     Disease.    Imaging / Studies: No results found.  Medications / Allergies: per chart  Antibiotics: Anti-infectives   Start     Dose/Rate Route Frequency Ordered Stop   08/16/13 1900  cefOXitin (MEFOXIN) 2 g in dextrose 5 % 50 mL IVPB     2 g 100 mL/hr over 30 Minutes Intravenous 3 times per day 08/16/13 1430     08/16/13 1145  cefOXitin (MEFOXIN) 2 g in dextrose 5 % 50 mL  IVPB    Comments:  Pharmacy may adjust dosing strength, interval, or rate of medication as needed for optimal therapy for the patient Send with patient on call to the OR.  Anesthesia to complete antibiotic administration <15mn [facility]   2 g 100 mL/hr over 30 Minutes Intravenous On call to O.R. 08/16/13 1132 08/16/13 1147   08/16/13 1045  piperacillin-tazobactam (ZOSYN) IVPB 3.375 g  Status:  Discontinued     3.375 g 12.5 mL/hr over 240 Minutes Intravenous On call to O.R. 08/16/13 1034 08/16/13 1038   08/16/13 1045  cefOXitin (MEFOXIN) 2 g in dextrose 5 % 50 mL IVPB  Status:  Discontinued    Comments:  Pharmacy may adjust dosing strength, interval, or rate of medication as needed for optimal therapy for the patient Send with patient on call to the OR.  Anesthesia to complete antibiotic administration <645m [facility]  2 g 100 mL/hr over 30 Minutes Intravenous On call to O.R. 08/16/13 1038 08/16/13 1044      Assessment/Plan  S/p laparoscopic appendectomy--08/13/13 Dr. B Dennis Bastncarcerated abdominal wall hernia with small bowel perforation s/p repair Dr. WyHulen Skains/6/15  POD#4 Advance to regular diet C/w miralax Mobilize  IS   Percocet and ibuprofen for pain SCD/lovenox  Cefoxitin 4/6---> does not need atbx at discharge Will need staples removed in 3-5 days Possible discharge later today or tomorrow.    EmErby PianANAccel [facility] Pager [phone] [phone] 20274/02/2014 10:34 AM

## 2013-08-20 NOTE — Discharge Summary (Signed)
Physician Discharge Summary  Mitchell [name] BJY:[id number] DOB: 19-Jan-1986 DOA: 08/16/2013  PCP: No PCP Per Patient  Consultation: none  Admit date: 08/16/2013 Discharge date: 08/20/2013  Recommendations for Outpatient Follow-up:    Follow-up Information   Follow up with [facility] On 08/24/2013. (arrive by 1:15pm for a 1:45pm appt to have staples removed and a quick check of your wound)    Contact information:   [address]   [place] [address] [phone]       Follow up with Liz Malady, MD On 09/01/2013. (arrive by 9AM for a 915AM appt for a post op check)    Specialty:  General Surgery   Contact information:   [address] [place] [address] [phone]      Discharge Diagnoses:  1. Incarcerated abdominal wall hernia with small bowel perforation at LLQ trochar site 2. S/p laparoscopic appendectomy Dr. Janee Morn 08/13/13   Surgical Procedure:  Repair of abdominal wall hernia and small bowel perforation by Dr. Lindie Spruce 08/16/13  Discharge Condition: stable Disposition: home  Diet recommendation: regular  Filed Weights   08/16/13 0947  Weight: 173 lb (78.472 kg)     Filed Vitals:   08/19/13 2325  BP: 131/85  Pulse: 82  Temp: 98.4 F (36.9 C)  Resp: 18     Hospital Course:  Mitchell [name] underwent a laparoscopic appendectomy by Dr. Janee Morn on the 3rd of April.  He was discharged home the following day.  He developed worsening abdominal pain and was seen in the ED at [facility] on the 6th and subsequently transferred to [facility].  He was found to have a incarcerated hernia with an obstruction.  He underwent a repair the same day.  He had an NGT placed which was removed as he began to open up.  He was kept on IV antibiotics due to a small bowel perforation without contamination.  His vital signs remained stable.  White count remained normal.  On POD #4 he was tolerating a regular diet, having bowel movements,  ambulating, pain well controlled.  He was therefore felt stable for discharge.  He will follow up on Tuesday to have the staples remove and a wound check in the [facility] clinic to ensure no post operative wound infection.  The wound was without erythema or fluctuance, but a bit swollen.  He will follow up with Dr. Janee Morn on the 22nd for a routine post op check.  We discussed warning signs that warrant immediate attention.  Medication risks and benefits were discussed.  He did not need additional antibiotics. He was treated for a total of 4 days of Mefoxin.    He was encouraged to call with questions or concerns.       Discharge Instructions   Future Appointments Provider Department Dept Phone   09/01/2013 9:15 AM Liz Malady, MD [facility], [place] [phone]       Medication List         ibuprofen 600 MG tablet  Commonly known as:  ADVIL,MOTRIN  Take 1 tablet (600 mg total) by mouth 3 (three) times daily.     oxyCODONE-acetaminophen 5-325 MG per tablet  Commonly known as:  PERCOCET/ROXICET  Take 1-2 tablets by mouth every 4 (four) hours as needed for severe pain.     polyethylene glycol packet  Commonly known as:  MIRALAX / GLYCOLAX  Take 17 g by mouth daily.  Follow-up Information   Follow up with [facility] On 08/24/2013. (arrive by 1:15pm for a 1:45pm appt to have staples removed and a quick check of your wound)    Contact information:   [address]   [place] [address] [phone]       Follow up with Liz MaladyHOMPSON,BURKE E, MD On 09/01/2013. (arrive by 9AM for a 915AM appt for a post op check)    Specialty:  General Surgery   Contact information:   [address] [place] [address] [phone]        The results of significant diagnostics from this hospitalization (including imaging, microbiology, ancillary and laboratory) are listed below for reference.    Significant Diagnostic Studies: Dg  Abd 1 View  08/18/2013   CLINICAL DATA:  Postoperative ileus.  EXAM: ABDOMEN - 1 VIEW  COMPARISON:  One-view abdomen 08/17/2012  FINDINGS: Previously seen small bowel dilation is improved. There is slight progression of contrast within the ascending colon. Gas-filled loops of colon are again noted. There is no definite free air.  IMPRESSION: 1. Decreased distention of small bowel compatible with resolving ileus.   Electronically Signed   By: Gennette Pachris  Mattern M.D.   On: 08/18/2013 07:48   Ct Abdomen Pelvis W Contrast  08/13/2013   CLINICAL DATA:  Abdominal pain, nausea  EXAM: CT ABDOMEN AND PELVIS WITH CONTRAST  TECHNIQUE: Multidetector CT imaging of the abdomen and pelvis was performed using the standard protocol following bolus administration of intravenous contrast.  CONTRAST:  100mL OMNIPAQUE IOHEXOL 300 MG/ML  SOLN  COMPARISON:  None.  FINDINGS: Lung bases are unremarkable. Sagittal images of the spine are unremarkable. Liver, pancreas, spleen and adrenals are unremarkable. No calcified gallstones are noted within gallbladder. Abdominal aorta is unremarkable. Kidneys are unremarkable. No hydronephrosis or hydroureter. Moderate stool in transverse colon. No small bowel obstruction. There is subtle mild stranding of pericolonic fat in right lower quadrant  A low lying cecum is noted. Nonspecific lymph nodes are noted in right lower quadrant mesentery the largest measures 1.2 cm in coronal image 41.  Moderate distension of the cecum with gas and stool. There is partially visualized abnormal appendix. There is a calcified appendicolith at the appendix base measures about 1 cm best seen in sagittal image 57. There is distension of the appendix with fluid the appendix measures at least 1 cm in diameter. The appendix is located just above the urinary bladder best seen in sagittal image 59. A second appendicolith is noted within appendix measures about 7 mm. Findings are consistent with acute appendicitis.  The appendix  is visualized in axial image 67. The appendix is somewhat medial to the cecum just above the urinary bladder.  IMPRESSION: 1. Findings highly suspicious for acute appendicitis. At least 2 appendicoliths are noted as described above. There is a low lying cecum. Stool and gas noted within cecum. 2. No hydronephrosis or hydroureter. 3. No small bowel obstruction.  These results were called by telephone at the time of interpretation on 08/13/2013 at 5:49 PM to Dr. Jaynie CrumbleATYANA KIRICHENKO , who verbally acknowledged these results.   Electronically Signed   By: Natasha MeadLiviu  Pop M.D.   On: 08/13/2013 17:49   Dg Abd Acute W/chest  08/13/2013   CLINICAL DATA:  Abdominal pain and vomiting  EXAM: ACUTE ABDOMEN SERIES (ABDOMEN 2 VIEW & CHEST 1 VIEW)  COMPARISON:  None.  FINDINGS: PA chest: Lungs are clear. Heart size and pulmonary vascularity  are normal. No adenopathy.  Supine and upright abdomen: There is moderate stool in the colon. There is no bowel dilatation or air-fluid level suggesting obstruction. No free air. No abnormal calcifications.  IMPRESSION: Bowel gas pattern unremarkable.  Lungs clear.   Electronically Signed   By: Bretta Bang M.D.   On: 08/13/2013 16:14   Dg Abd Portable 1v  08/17/2013   CLINICAL DATA:  NGT placement  EXAM: PORTABLE ABDOMEN - 1 VIEW  COMPARISON:  08/15/2013  FINDINGS: Scattered large and small bowel gas is noted. Mild small bowel dilatation is seen. Changes consistent with recent hernia repair in the left lower quadrant are noted. Contrast material is noted within the right colon from recent CT examination. No nasogastric catheter is noted and likely is coiled within the upper esophagus. Clinical correlation is recommended.  IMPRESSION: No NG catheter identified.  Scattered large and small bowel gas with mild persistent small bowel dilatation.   Electronically Signed   By: Alcide Clever M.D.   On: 08/17/2013 15:45    Microbiology: No results found for this or any previous visit (from the past  240 hour(s)).   Labs: Basic Metabolic Panel:  Recent Labs Lab 08/13/13 1518 08/18/13 0700 08/19/13 0625  NA 141 141 137  K 3.7 4.4 4.4  CL 99 100 97  CO2 27 27 29   GLUCOSE 103* 83 78  BUN 12 5* 5*  CREATININE 0.89 0.88 0.93  CALCIUM 9.9 9.2 9.1   Liver Function Tests:  Recent Labs Lab 08/13/13 1518 08/18/13 0700  AST 20 31  ALT 8 9  ALKPHOS 94 60  BILITOT 0.7 0.7  PROT 7.8 6.6  ALBUMIN 4.4 3.5    Recent Labs Lab 08/13/13 1518  LIPASE 13   No results found for this basename: AMMONIA,  in the last 168 hours CBC:  Recent Labs Lab 08/13/13 1518 08/18/13 0700 08/19/13 0625  WBC 15.4* 8.6 8.3  NEUTROABS 12.9*  --   --   HGB 15.5 13.1 12.8*  HCT 42.8 37.0* 35.6*  MCV 87.5 87.9 87.0  PLT 186 157 162   Cardiac Enzymes: No results found for this basename: CKTOTAL, CKMB, CKMBINDEX, TROPONINI,  in the last 168 hours BNP: BNP (last 3 results) No results found for this basename: PROBNP,  in the last 8760 hours CBG: No results found for this basename: GLUCAP,  in the last 168 hours  Active Problems:   Hernia with obstruction   Time coordinating discharge: <30 mins  Signed:  Kayen Grabel, ANP-BC

## 2013-08-20 NOTE — Progress Notes (Signed)
Patient provided with discharge instructions and follow up information. He is going home at this time with no further concerns.

## 2013-08-20 NOTE — Discharge Instructions (Signed)
LAPAROSCOPIC SURGERY: POST OP INSTRUCTIONS ° °1. DIET: Follow a light bland diet the first 24 hours after arrival home, such as soup, liquids, crackers, etc.  Be sure to include lots of fluids daily.  Avoid fast food or heavy meals as your are more likely to get nauseated.  Eat a low fat the next few days after surgery.   °2. Take your usually prescribed home medications unless otherwise directed. °3. PAIN CONTROL: °a. Pain is best controlled by a usual combination of three different methods TOGETHER: °i. Ice/Heat °ii. Over the counter pain medication °iii. Prescription pain medication °b. Most patients will experience some swelling and bruising around the incisions.  Ice packs or heating pads (30-60 minutes up to 6 times a day) will help. Use ice for the first few days to help decrease swelling and bruising, then switch to heat to help relax tight/sore spots and speed recovery.  Some people prefer to use ice alone, heat alone, alternating between ice & heat.  Experiment to what works for you.  Swelling and bruising can take several weeks to resolve.   °c. It is helpful to take an over-the-counter pain medication regularly for the first few weeks.  Choose one of the following that works best for you: °i. Naproxen (Aleve, etc)  Two 220mg tabs twice a day °ii. Ibuprofen (Advil, etc) Three 200mg tabs four times a day (every meal & bedtime) °iii. Acetaminophen (Tylenol, etc) 500-650mg four times a day (every meal & bedtime) °d. A  prescription for pain medication (such as oxycodone, hydrocodone, etc) should be given to you upon discharge.  Take your pain medication as prescribed.  °i. If you are having problems/concerns with the prescription medicine (does not control pain, nausea, vomiting, rash, itching, etc), please call us [phone] to see if we need to switch you to a different pain medicine that will work better for you and/or control your side effect better. °ii. If you need a refill on your pain medication,  please contact your pharmacy.  They will contact our office to request authorization. Prescriptions will not be filled after 5 pm or on week-ends. °4. Avoid getting constipated.  Between the surgery and the pain medications, it is common to experience some constipation.  Increasing fluid intake and taking a fiber supplement (such as Metamucil, Citrucel, FiberCon, MiraLax, etc) 1-2 times a day regularly will usually help prevent this problem from occurring.  A mild laxative (prune juice, Milk of Magnesia, MiraLax, etc) should be taken according to package directions if there are no bowel movements after 48 hours.   °5. Watch out for diarrhea.  If you have many loose bowel movements, simplify your diet to bland foods & liquids for a few days.  Stop any stool softeners and decrease your fiber supplement.  Switching to mild anti-diarrheal medications (Kayopectate, Pepto Bismol) can help.  If this worsens or does not improve, please call us. °6. Wash / shower every day.  You may shower over the dressings as they are waterproof.  Continue to shower over incision(s) after the dressing is off. °7. Remove your waterproof bandages 5 days after surgery.  You may leave the incision open to air.  You may replace a dressing/Band-Aid to cover the incision for comfort if you wish.  °8. ACTIVITIES as tolerated:   °a. You may resume regular (light) daily activities beginning the next day--such as daily self-care, walking, climbing stairs--gradually increasing activities as tolerated.  If you can walk 30 minutes without difficulty, it   is safe to try more intense activity such as jogging, treadmill, bicycling, low-impact aerobics, swimming, etc. b. Save the most intensive and strenuous activity for last such as sit-ups, heavy lifting, contact sports, etc  Refrain from any heavy lifting or straining until you are off narcotics for pain control.   c. DO NOT PUSH THROUGH PAIN.  Let pain be your guide: If it hurts to do something, don't  do it.  Pain is your body warning you to avoid that activity for another week until the pain goes down. d. You may drive when you are no longer taking prescription pain medication, you can comfortably wear a seatbelt, and you can safely maneuver your car and apply brakes. e. Bonita QuinYou may have sexual intercourse when it is comfortable.  9. FOLLOW UP in our office a. Please call [facility] at [phone] to set up an appointment to see your surgeon in the office for a follow-up appointment approximately 2-3 weeks after your surgery. b. Make sure that you call for this appointment the day you arrive home to insure a convenient appointment time. 10. IF YOU HAVE DISABILITY OR FAMILY LEAVE FORMS, BRING THEM TO THE OFFICE FOR PROCESSING.  DO NOT GIVE THEM TO YOUR DOCTOR.   WHEN TO CALL US [phone]: 1. Poor pain control 2. Reactions / problems with new medications (rash/itching, nausea, etc)  3. Fever over 101.5 F (38.5 C) 4. Inability to urinate 5. Nausea and/or vomiting 6. Worsening swelling or bruising 7. Continued bleeding from incision. 8. Increased pain, redness, or drainage from the incision   The clinic staff is available to answer your questions during regular business hours (8:30am-5pm).  Please dont hesitate to call and ask to speak to one of our nurses for clinical concerns.   If you have a medical emergency, go to the nearest emergency room or call 911.  A surgeon from [facility] is always on call at the [facility]   [facility] [place] [address], [address], [place], [place]  [address] ? MAIN: [phone] ? TOLL FREE: [phone] ?  FAX [phone] [email or web address]  [facility]  UMBILICAL OR INGUINAL HERNIA REPAIR: POST OP INSTRUCTIONS  Always review your discharge instruction sheet given to you by the facility where your surgery was performed. IF YOU HAVE DISABILITY OR FAMILY LEAVE FORMS, YOU MUST  BRING THEM TO THE OFFICE FOR PROCESSING.   DO NOT GIVE THEM TO YOUR DOCTOR.  10. A  prescription for pain medication may be given to you upon discharge.  Take your pain medication as prescribed, if needed.  If narcotic pain medicine is not needed, then you may take acetaminophen (Tylenol) or ibuprofen (Advil) as needed. 11. Take your usually prescribed medications unless otherwise directed. 12. If you need a refill on your pain medication, please contact your pharmacy.  They will contact our office to request authorization. Prescriptions will not be filled after 5 pm or on week-ends. 13. You should follow a light diet the first 24 hours after arrival home, such as soup and crackers, etc.  Be sure to include lots of fluids daily.  Resume your normal diet the day after surgery. 14. Most patients will experience some swelling and bruising around the umbilicus or in the groin and scrotum.  Ice packs and reclining will help.  Swelling and bruising can take several days to resolve.  15. It is common to experience some constipation if taking pain medication after surgery.  Increasing  fluid intake and taking a stool softener (such as Colace) will usually help or prevent this problem from occurring.  A mild laxative (Milk of Magnesia or Miralax) should be taken according to package directions if there are no bowel movements after 48 hours. 16. Unless discharge instructions indicate otherwise, you may remove your bandages 24-48 hours after surgery, and you may shower at that time.  You may have steri-strips (small skin tapes) in place directly over the incision.  These strips should be left on the skin for 7-10 days.  If your surgeon used skin glue on the incision, you may shower in 24 hours.  The glue will flake off over the next 2-3 weeks.  Any sutures or staples will be removed at the office during your follow-up visit. 17. ACTIVITIES:  You may resume regular (light) daily activities beginning the next day--such  as daily self-care, walking, climbing stairs--gradually increasing activities as tolerated.  You may have sexual intercourse when it is comfortable.  Refrain from any heavy lifting or straining until approved by your doctor. a. You may drive when you are no longer taking prescription pain medication, you can comfortably wear a seatbelt, and you can safely maneuver your car and apply brakes. b. RETURN TO WORK:  __________________________________________________________ 18. You should see your doctor in the office for a follow-up appointment approximately 2-3 weeks after your surgery.  Make sure that you call for this appointment within a day or two after you arrive home to insure a convenient appointment time. 19. OTHER INSTRUCTIONS:  __________________________________________________________________________________________________________________________________________________________________________________________  WHEN TO CALL YOUR DOCTOR: 9. Fever over 101.0 10. Inability to urinate 11. Nausea and/or vomiting 12. Extreme swelling or bruising 13. Continued bleeding from incision. 14. Increased pain, redness, or drainage from the incision  The clinic staff is available to answer your questions during regular business hours.  Please dont hesitate to call and ask to speak to one of the nurses for clinical concerns.  If you have a medical emergency, go to the nearest emergency room or call 911.  A surgeon from [facility] is always on call at the hospital   [address], [place], [place]  [address] ?  [address] [address], [place], [place]   [address] [phone] ? [phone] ? FAX [phone] Web site: [email or web address]

## 2013-08-21 ENCOUNTER — Emergency Department (HOSPITAL_COMMUNITY): Payer: Self-pay

## 2013-08-21 ENCOUNTER — Encounter (HOSPITAL_COMMUNITY): Payer: Self-pay | Admitting: Emergency Medicine

## 2013-08-21 ENCOUNTER — Emergency Department (HOSPITAL_COMMUNITY)
Admission: EM | Admit: 2013-08-21 | Discharge: 2013-08-22 | Disposition: A | Discharge status: Home / Self Care | Payer: Self-pay | Attending: Emergency Medicine | Admitting: Emergency Medicine

## 2013-08-21 DIAGNOSIS — R1032 Left lower quadrant pain: Secondary | ICD-10-CM | POA: Insufficient documentation

## 2013-08-21 DIAGNOSIS — Z79899 Other long term (current) drug therapy: Secondary | ICD-10-CM | POA: Insufficient documentation

## 2013-08-21 DIAGNOSIS — R1031 Right lower quadrant pain: Secondary | ICD-10-CM | POA: Insufficient documentation

## 2013-08-21 DIAGNOSIS — F17200 Nicotine dependence, unspecified, uncomplicated: Secondary | ICD-10-CM | POA: Insufficient documentation

## 2013-08-21 DIAGNOSIS — Z9089 Acquired absence of other organs: Secondary | ICD-10-CM | POA: Insufficient documentation

## 2013-08-21 DIAGNOSIS — K921 Melena: Secondary | ICD-10-CM | POA: Insufficient documentation

## 2013-08-21 HISTORY — DX: Unspecified appendicitis: K37

## 2013-08-21 LAB — COMPREHENSIVE METABOLIC PANEL
ALBUMIN: 4 g/dL (ref 3.5–5.2)
ALT: 13 U/L (ref 0–53)
AST: 30 U/L (ref 0–37)
Alkaline Phosphatase: 59 U/L (ref 39–117)
BUN: 21 mg/dL (ref 6–23)
CALCIUM: 9.8 mg/dL (ref 8.4–10.5)
CO2: 26 mEq/L (ref 19–32)
Chloride: 98 mEq/L (ref 96–112)
Creatinine, Ser: 0.93 mg/dL (ref 0.50–1.35)
GFR calc Af Amer: >90 mL/min (ref >90)
GFR calc non Af Amer: >90 mL/min (ref >90)
Glucose, Bld: 95 mg/dL (ref 70–99)
Potassium: 4.3 mEq/L (ref 3.7–5.3)
Sodium: 139 mEq/L (ref 137–147)
TOTAL PROTEIN: 7.5 g/dL (ref 6.0–8.3)
Total Bilirubin: 0.7 mg/dL (ref 0.3–1.2)

## 2013-08-21 LAB — CBC WITH DIFFERENTIAL/PLATELET
Basophils Absolute: 0 10*3/uL (ref 0.0–0.1)
Basophils Relative: 0 % (ref 0–1)
EOS ABS: 0.5 10*3/uL (ref 0.0–0.7)
EOS PCT: 5 % (ref 0–5)
HCT: 33.5 % — ABNORMAL LOW (ref 39.0–52.0)
Hemoglobin: 12 g/dL — ABNORMAL LOW (ref 13.0–17.0)
Lymphocytes Relative: 21 % (ref 12–46)
Lymphs Abs: 1.8 10*3/uL (ref 0.7–4.0)
MCH: 31.3 pg (ref 26.0–34.0)
MCHC: 35.8 g/dL (ref 30.0–36.0)
MCV: 87.2 fL (ref 78.0–100.0)
Monocytes Absolute: 0.8 10*3/uL (ref 0.1–1.0)
Monocytes Relative: 9 % (ref 3–12)
Neutro Abs: 5.8 10*3/uL (ref 1.7–7.7)
Neutrophils Relative %: 65 % (ref 43–77)
PLATELETS: 237 10*3/uL (ref 150–400)
RBC: 3.84 MIL/uL — ABNORMAL LOW (ref 4.22–5.81)
RDW: 12.1 % (ref 11.5–15.5)
WBC: 8.9 10*3/uL (ref 4.0–10.5)

## 2013-08-21 LAB — URINALYSIS, ROUTINE W REFLEX MICROSCOPIC
BILIRUBIN URINE: NEGATIVE
Glucose, UA: NEGATIVE mg/dL
Hgb urine dipstick: NEGATIVE
Ketones, ur: 15 mg/dL — AB
LEUKOCYTES UA: NEGATIVE
NITRITE: NEGATIVE
PH: 7 (ref 5.0–8.0)
Protein, ur: NEGATIVE mg/dL
SPECIFIC GRAVITY, URINE: 1.016 (ref 1.005–1.030)
Urobilinogen, UA: 0.2 mg/dL (ref 0.0–1.0)

## 2013-08-21 LAB — POC OCCULT BLOOD, ED: Fecal Occult Bld: POSITIVE — AB

## 2013-08-21 MED ORDER — IOHEXOL 300 MG/ML  SOLN
100.0000 mL | Freq: Once | INTRAMUSCULAR | Status: AC | PRN
  Administered 2013-08-21: 100 mL via INTRAVENOUS

## 2013-08-21 MED ORDER — ONDANSETRON HCL 4 MG/2ML IJ SOLN
4.0000 mg | Freq: Once | INTRAMUSCULAR | Status: AC
  Administered 2013-08-21: 4 mg via INTRAVENOUS
  Filled 2013-08-21: qty 2

## 2013-08-21 MED ORDER — IOHEXOL 300 MG/ML  SOLN
20.0000 mL | INTRAMUSCULAR | Status: AC
  Administered 2013-08-21: 25 mL via ORAL

## 2013-08-21 MED ORDER — MORPHINE SULFATE 4 MG/ML IJ SOLN
4.0000 mg | Freq: Once | INTRAMUSCULAR | Status: AC
  Administered 2013-08-21: 4 mg via INTRAVENOUS
  Filled 2013-08-21: qty 1

## 2013-08-21 MED ORDER — HYDROMORPHONE HCL PF 1 MG/ML IJ SOLN
1.0000 mg | Freq: Once | INTRAMUSCULAR | Status: AC
  Administered 2013-08-21: 1 mg via INTRAVENOUS
  Filled 2013-08-21: qty 1

## 2013-08-21 MED ORDER — SODIUM CHLORIDE 0.9 % IV BOLUS (SEPSIS)
1000.0000 mL | Freq: Once | INTRAVENOUS | Status: AC
  Administered 2013-08-21: 1000 mL via INTRAVENOUS

## 2013-08-21 NOTE — ED Notes (Signed)
Pt reports had surgery for appendix and hernia, perforated bowels on Friday and Monday, pt c/o having pain even after taking prescribed medications. Pt presents with 6 staples to left lower abdomen area, area red in swollen in color. Pt reports a lot of blood noted on dressing. Pt also reports having large amounts of blood in bowel area today. Last dose of pain medication was 0400. Pt is experiencing nausea, SOB and fatigue.

## 2013-08-21 NOTE — ED Provider Notes (Signed)
CSN: [id number]     Arrival date & time 08/21/13  1622 History   First MD Initiated Contact with Patient 08/21/13 1646     Chief Complaint  Patient presents with  . Post-op Problem     (Consider location/radiation/quality/duration/timing/severity/associated sxs/prior Treatment) The history is provided by the patient and medical records.   History of present illness: 28 year old male with history of appendectomy one week ago complicated by a left lower quadrant small bowel perforation and internal hernia treated 5 days ago with discharge from the hospital yesterday who presents with chief complaint of blood in stool. Onset of symptoms was this morning. Patient reports several bowel movements that were described as dark in color with bright red blood in the stool after he flushed. He is also had associated lower abdominal pain. He has not taken any of his pain medication since early this morning. Pain is located across the lower abdomen, described as "crampy gas", is constant, exacerbated by movement, currently rated severe. He also has associated symptoms of nausea. He denies any fevers. No worsening of redness at his surgical sites.  Past Medical History  Diagnosis Date  . Appendicitis    Past Surgical History  Procedure Laterality Date  . Laparoscopic appendectomy N/A 08/13/2013    Procedure: APPENDECTOMY LAPAROSCOPIC;  Surgeon: Liz Malady, MD;  Location: [facility] OR;  Service: General;  Laterality: N/A;  . Laparotomy N/A 08/16/2013    Procedure: INCARCERATED ABDOMINAL WALL HERNIA REPAIR;  Surgeon: Cherylynn Ridges, MD;  Location: [facility] OR;  Service: General;  Laterality: N/A;   No family history on file. History  Substance Use Topics  . Smoking status: Current Every Day Smoker -- 1.00 packs/day  . Smokeless tobacco: Not on file  . Alcohol Use: Yes    Review of Systems  Constitutional: Negative for fever and chills.  HENT: Negative.   Eyes: Negative.   Respiratory: Negative for cough and  shortness of breath.   Cardiovascular: Negative for chest pain and palpitations.  Gastrointestinal: Positive for abdominal pain and blood in stool. Negative for nausea, vomiting, diarrhea and constipation.  Genitourinary: Negative.   Musculoskeletal: Negative.   Skin: Negative.   Neurological: Negative.   All other systems reviewed and are negative.     Allergies  Review of patient's allergies indicates no known allergies.  Home Medications   Current Outpatient Rx  Name  Route  Sig  Dispense  Refill  . ibuprofen (ADVIL,MOTRIN) 600 MG tablet   Oral   Take 1 tablet (600 mg total) by mouth 3 (three) times daily.   30 tablet   0   . oxyCODONE-acetaminophen (PERCOCET/ROXICET) 5-325 MG per tablet   Oral   Take 1-2 tablets by mouth every 4 (four) hours as needed for severe pain.   40 tablet   0   . polyethylene glycol (MIRALAX / GLYCOLAX) packet   Oral   Take 17 g by mouth daily.   14 each   0   . hydrocortisone (ANUSOL-HC) 25 MG suppository   Rectal   Place 1 suppository (25 mg total) rectally 2 (two) times daily. For 7 days   14 suppository   0    BP 102/67  Pulse 72  Temp(Src) 98.4 F (36.9 C) (Oral)  Resp 16  Ht 5\' 10"  (1.778 m)  Wt 173 lb (78.472 kg)  BMI 24.82 kg/m2  SpO2 99% Physical Exam  Nursing note and vitals reviewed. Constitutional: He is oriented to person, place, and time. He appears well-developed and  well-nourished. No distress.  HENT:  Head: Normocephalic and atraumatic.  Eyes: Conjunctivae are normal.  Neck: Neck supple.  Cardiovascular: Normal rate, regular rhythm, normal heart sounds and intact distal pulses.   Pulmonary/Chest: Effort normal and breath sounds normal. He has no wheezes. He has no rales.  Abdominal: Soft. He exhibits no distension. There is tenderness in the right lower quadrant and left lower quadrant.  Surgical site c/d/i with staples in place in the LLQ.  No surrounding erythema or fluctuance  Genitourinary: Rectal exam  shows no external hemorrhoid.  Gross blood on rectal exam  Musculoskeletal: Normal range of motion.  Neurological: He is alert and oriented to person, place, and time.  Skin: Skin is warm and dry.    ED Course  Procedures (including critical care time) Labs Review Labs Reviewed  CBC WITH DIFFERENTIAL - Abnormal; Notable for the following:    RBC 3.84 (*)    Hemoglobin 12.0 (*)    HCT 33.5 (*)    All other components within normal limits  URINALYSIS, ROUTINE W REFLEX MICROSCOPIC - Abnormal; Notable for the following:    Ketones, ur 15 (*)    All other components within normal limits  POC OCCULT BLOOD, ED - Abnormal; Notable for the following:    Fecal Occult Bld POSITIVE (*)    All other components within normal limits  COMPREHENSIVE METABOLIC PANEL  OCCULT BLOOD X 1 CARD TO LAB, STOOL   Imaging Review Ct Abdomen Pelvis W Contrast  08/21/2013   CLINICAL DATA:  Abdominal pain and bloody stool  EXAM: CT ABDOMEN AND PELVIS WITH CONTRAST  TECHNIQUE: Multidetector CT imaging of the abdomen and pelvis was performed using the standard protocol following bolus administration of intravenous contrast.  CONTRAST:  100mL OMNIPAQUE IOHEXOL 300 MG/ML  SOLN  COMPARISON:  08/15/2013.  FINDINGS: The lung bases are clear.  No pleural or pericardial effusion.  No focal liver abnormality. The gallbladder is normal. No biliary dilatation. Normal appearance of the pancreas. The spleen is negative.  The adrenal glands are both normal. The kidneys are both unremarkable. The urinary bladder appears normal. Prostate gland and seminal vesicles are normal.  Normal caliber of the abdominal aorta. No aneurysm. There are no pathologically enlarged lymph nodes within the upper abdomen. There are are no enlarged pelvic or inguinal lymph nodes identified. No free fluid or fluid collection identified within the abdomen or pelvis.  The stomach is normal. The small bowel loops have a normal course and caliber without evidence  for bowel obstruction. The patient is status post appendectomy. The colon appears normal.  In the left lower quadrant ventral abdominal wall there are several surgical staples. A hyper dense mass is identified underlying the surgical staples measuring 4.5 x 2.8 cm, image 64/series 2. A smaller hyperdense mass is identified within the right lower quadrant ventral abdominal wall measuring 2.3 x 1.9 cm, image 48/series 2. Review of the visualized bony structures is significant for bilateral L5 pars defects.  IMPRESSION: 1. Hyperdense masses are identified within the right lower quadrant and left lower quadrant ventral abdominal wall compatible with hematomas. 2. No acute findings identified within the abdominal or pelvic cavity to explain patient's bloody stool. 3. Bilateral L5 pars defects.   Electronically Signed   By: Signa Kellaylor  Stroud M.D.   On: 08/21/2013 22:03     EKG Interpretation None      MDM   Final diagnoses:  Hematochezia    28 year old male with history of appendectomy one week ago  complicated by internal hernia with bowel perforation that was repaired 5 days ago who presents with multiple episodes of bright red blood per rectum today. Initially tachycardic. Vital signs normalized with IV fluids and Dilaudid. Exam with tenderness to palpation across the lower abdomen. Surgical sites clean dry and intact without evidence of infection. Gross blood on rectal exam.  Labs showed hemoglobin of 12 down from 12.82 days ago. Labs otherwise unremarkable. CT of the abdomen and pelvis with contrast showed no findings to explain patient's blood in his stool.  General surgery consulted and will evaluate the patient in the emergency department.  Surgery consult pending at time of patient care transfer.  Cherre Robins, MD 08/22/13 [phone]

## 2013-08-22 DIAGNOSIS — K625 Hemorrhage of anus and rectum: Secondary | ICD-10-CM

## 2013-08-22 MED ORDER — HYDROMORPHONE HCL PF 1 MG/ML IJ SOLN
1.0000 mg | Freq: Once | INTRAMUSCULAR | Status: AC
  Administered 2013-08-22: 1 mg via INTRAVENOUS
  Filled 2013-08-22: qty 1

## 2013-08-22 MED ORDER — HYDROMORPHONE HCL PF 1 MG/ML IJ SOLN
1.0000 mg | INTRAMUSCULAR | Status: AC
  Administered 2013-08-22: 1 mg via INTRAVENOUS
  Filled 2013-08-22: qty 1

## 2013-08-22 MED ORDER — DIPHENHYDRAMINE HCL 50 MG/ML IJ SOLN
25.0000 mg | Freq: Once | INTRAMUSCULAR | Status: AC
  Administered 2013-08-22: 25 mg via INTRAVENOUS
  Filled 2013-08-22: qty 1

## 2013-08-22 MED ORDER — HYDROCORTISONE ACETATE 25 MG RE SUPP: 25.0000 mg | Freq: Two times a day (BID) | RECTAL | Status: DC

## 2013-08-22 MED ORDER — ONDANSETRON HCL 4 MG/2ML IJ SOLN
4.0000 mg | Freq: Once | INTRAMUSCULAR | Status: AC
  Administered 2013-08-22: 4 mg via INTRAVENOUS
  Filled 2013-08-22: qty 2

## 2013-08-22 NOTE — ED Provider Notes (Signed)
Seen by surgical team They have reviewed imaging/labs They do not feel he has acute post operative complication Suspect internal hemorrhoids He has been cleared for discharge home  BP 112/56  Pulse 72  Temp(Src) 98.4 F (36.9 C) (Oral)  Resp 11  Ht 5\' 10"  (1.778 m)  Wt 173 lb (78.472 kg)  BMI 24.82 kg/m2  SpO2 100%   Joya Gaskinsonald W Rashema Seawright, MD 08/22/13 [phone]

## 2013-08-22 NOTE — Progress Notes (Signed)
I have seen and examined the pt and agree with PA-Osborne's progress note.  

## 2013-08-22 NOTE — Progress Notes (Signed)
Patient ID: Mitchell MessickBrian D Boehm, male   DOB: 08/03/1985, 28 y.o.   MRN: [id number]    Subjective: Pt is known to us for recent lap appy with incision hernia that require repair this past week.  He just went home 2 days ago.  He started having some BRBPR with his BMs yesterday.  The blood was mixed in his stool and on his toilet paper.  It did not fill the toilet up.  He denies dizziness or weakness.  [facility]  He had a CT scan that revealed a moderate sized hematoma under his LLQ incision and one at his umbilical incision which was much smaller.  We have been asked to evaluate the patient for further recommendations.  Objective: Vital signs in last 24 hours: Temp:  [98.4 F (36.9 C)-99.3 F (37.4 C)] 98.4 F (36.9 C) (04/11 2032) Pulse Rate:  [67-129] 72 (04/12 0800) Resp:  [9-33] 11 (04/12 0800) BP: (91-131)/(40-79) 112/56 mmHg (04/12 0800) SpO2:  [96 %-100 %] 100 % (04/12 0800) Weight:  [173 lb (78.472 kg)] 173 lb (78.472 kg) (04/11 1636)    Intake/Output from previous day:   Intake/Output this shift:    PE: Abd: soft, appropriately tender, incision is c/d/i with bulge from hematoma.  Staples were removed and hematoma evacuated.  I washed wound out and taught his girlfriend how to repack his wound.  Good BS, ND, other incisions c/d/i  Rectal: difficult to get high in rectal vault due to angulation, but possible internal hemorrhoids palpable.  No blood on glove.  Lab Results:   Recent Labs  08/21/13 1700  WBC 8.9  HGB 12.0*  HCT 33.5*  PLT 237   BMET  Recent Labs  08/21/13 1700  NA 139  K 4.3  CL 98  CO2 26  GLUCOSE 95  BUN 21  CREATININE 0.93  CALCIUM 9.8   PT/INR No results found for this basename: LABPROT, INR,  in the last 72 hours CMP     Component Value Date/Time   NA 139 08/21/2013 1700   K 4.3 08/21/2013 1700   CL 98 08/21/2013 1700   CO2 26 08/21/2013 1700   GLUCOSE 95 08/21/2013 1700   BUN 21 08/21/2013 1700   CREATININE 0.93 08/21/2013  1700   CALCIUM 9.8 08/21/2013 1700   PROT 7.5 08/21/2013 1700   ALBUMIN 4.0 08/21/2013 1700   AST 30 08/21/2013 1700   ALT 13 08/21/2013 1700   ALKPHOS 59 08/21/2013 1700   BILITOT 0.7 08/21/2013 1700   GFRNONAA >90 08/21/2013 1700   GFRAA >90 08/21/2013 1700   Lipase     Component Value Date/Time   LIPASE 13 08/13/2013 1518       Studies/Results: Ct Abdomen Pelvis W Contrast  08/21/2013   CLINICAL DATA:  Abdominal pain and bloody stool  EXAM: CT ABDOMEN AND PELVIS WITH CONTRAST  TECHNIQUE: Multidetector CT imaging of the abdomen and pelvis was performed using the standard protocol following bolus administration of intravenous contrast.  CONTRAST:  100mL OMNIPAQUE IOHEXOL 300 MG/ML  SOLN  COMPARISON:  08/15/2013.  FINDINGS: The lung bases are clear.  No pleural or pericardial effusion.  No focal liver abnormality. The gallbladder is normal. No biliary dilatation. Normal appearance of the pancreas. The spleen is negative.  The adrenal glands are both normal. The kidneys are both unremarkable. The urinary bladder appears normal. Prostate gland and seminal vesicles are normal.  Normal caliber of the abdominal aorta. No aneurysm. There are no pathologically enlarged  lymph nodes within the upper abdomen. There are are no enlarged pelvic or inguinal lymph nodes identified. No free fluid or fluid collection identified within the abdomen or pelvis.  The stomach is normal. The small bowel loops have a normal course and caliber without evidence for bowel obstruction. The patient is status post appendectomy. The colon appears normal.  In the left lower quadrant ventral abdominal wall there are several surgical staples. A hyper dense mass is identified underlying the surgical staples measuring 4.5 x 2.8 cm, image 64/series 2. A smaller hyperdense mass is identified within the right lower quadrant ventral abdominal wall measuring 2.3 x 1.9 cm, image 48/series 2. Review of the visualized bony structures is significant  for bilateral L5 pars defects.  IMPRESSION: 1. Hyperdense masses are identified within the right lower quadrant and left lower quadrant ventral abdominal wall compatible with hematomas. 2. No acute findings identified within the abdominal or pelvic cavity to explain patient's bloody stool. 3. Bilateral L5 pars defects.   Electronically Signed   By: Signa Kell M.D.   On: 08/21/2013 22:03    Anti-infectives: Anti-infectives   None       Assessment/Plan  1. POD 9, s/p lap appy 2. POD 6, s/p repair of incisional hernia and oversew of enterotomy 3. BRBPR, likely internal hemorrhoids 4. Incisional hematoma  Plan: 1. He will begin NS WD dressing changes to his wound BID.  No HH arranged as GF taught how to do dressings. 2. anusol suppository will be given for hemorrhoids as well as miralax, which he has at home.  I have encouraged him to use this and to increase his water and fiber intake. 3. He has follow up with Dr. Janee Morn on 09-01-13. 4. He is ok for dc home.  D/w Dr. Derrell Lolling  LOS: 1 day    Letha Cape 08/22/2013, 10:15 AM Pager: [phone]

## 2013-08-22 NOTE — Discharge Instructions (Signed)
Dressing Change A dressing is a material placed over wounds. It keeps the wound clean, dry, and protected from further injury. This provides an environment that favors wound healing.  BEFORE YOU BEGIN  Get your supplies together. Things you may need include:  Saline solution.  Flexible gauze dressing.  Medicated cream.  Tape.  Gloves.  Abdominal dressing pads.  Gauze squares.  Plastic bags.  Take pain medicine 30 minutes before the dressing change if you need it.  Take a shower before you do the first dressing change of the day. Use plastic wrap or a plastic bag to prevent the dressing from getting wet. REMOVING YOUR OLD DRESSING   Wash your hands with soap and water. Dry your hands with a clean towel.  Put on your gloves.  Remove any tape.  Carefully remove the old dressing. If the dressing sticks, you may dampen it with warm water to loosen it, or follow your caregiver's specific directions.  Remove any gauze or packing tape that is in your wound.  Take off your gloves.  Put the gloves, tape, gauze, or any packing tape into a plastic bag. CHANGING YOUR DRESSING  Open the supplies.  Take the cap off the saline solution.  Open the gauze package so that the gauze remains on the inside of the package.  Put on your gloves.  Clean your wound as told by your caregiver.  If you have been told to keep your wound dry, follow those instructions.  Your caregiver may tell you to do one or more of the following:  Pick up the gauze. Pour the saline solution over the gauze. Squeeze out the extra saline solution.  Put medicated cream or other medicine on your wound if you have been told to do so.  Put the solution soaked gauze only in your wound, not on the skin around it.  Pack your wound loosely or as told by your caregiver.  Put dry gauze on your wound.  Put abdominal dressing pads over the dry gauze if your wet gauze soaks through.  Tape the abdominal dressing  pads in place so they will not fall off. Do not wrap the tape completely around the affected part (arm, leg, abdomen).  Wrap the dressing pads with a flexible gauze dressing to secure it in place.  Take off your gloves. Put them in the plastic bag with the old dressing. Tie the bag shut and throw it away.  Keep the dressing clean and dry until your next dressing change.  Wash your hands. SEEK MEDICAL CARE IF:  Your skin around the wound looks red.  Your wound feels more tender or sore.  You see pus in the wound.  Your wound smells bad.  You have a fever.  Your skin around the wound has a rash that itches and burns.  You see black or yellow skin in your wound that was not there before.  You feel nauseous, throw up, and feel very tired. Document Released: 06/06/2004 Document Revised: 07/22/2011 Document Reviewed: 03/11/2011 [facility] Patient Information 2014 [place], [place].   Hemorrhoids Hemorrhoids are swollen veins around the rectum or anus. There are two types of hemorrhoids:   Internal hemorrhoids. These occur in the veins just inside the rectum. They may poke through to the outside and become irritated and painful.  External hemorrhoids. These occur in the veins outside the anus and can be felt as a painful swelling or hard lump near the anus. CAUSES  Pregnancy.   Obesity.  Constipation or diarrhea.   Straining to have a bowel movement.   Sitting for long periods on the toilet.  Heavy lifting or other activity that caused you to strain.  Anal intercourse. SYMPTOMS   Pain.   Anal itching or irritation.   Rectal bleeding.   Fecal leakage.   Anal swelling.   One or more lumps around the anus.  DIAGNOSIS  Your caregiver may be able to diagnose hemorrhoids by visual examination. Other examinations or tests that may be performed include:   Examination of the rectal area with a gloved hand (digital rectal exam).   Examination of anal canal  using a small tube (scope).   A blood test if you have lost a significant amount of blood.  A test to look inside the colon (sigmoidoscopy or colonoscopy). TREATMENT Most hemorrhoids can be treated at home. However, if symptoms do not seem to be getting better or if you have a lot of rectal bleeding, your caregiver may perform a procedure to help make the hemorrhoids get smaller or remove them completely. Possible treatments include:   Placing a rubber band at the base of the hemorrhoid to cut off the circulation (rubber band ligation).   Injecting a chemical to shrink the hemorrhoid (sclerotherapy).   Using a tool to burn the hemorrhoid (infrared light therapy).   Surgically removing the hemorrhoid (hemorrhoidectomy).   Stapling the hemorrhoid to block blood flow to the tissue (hemorrhoid stapling).  HOME CARE INSTRUCTIONS   Eat foods with fiber, such as whole grains, beans, nuts, fruits, and vegetables. Ask your doctor about taking products with added fiber in them (fibersupplements).  Increase fluid intake. Drink enough water and fluids to keep your urine clear or pale yellow.   Exercise regularly.   Go to the bathroom when you have the urge to have a bowel movement. Do not wait.   Avoid straining to have bowel movements.   Keep the anal area dry and clean. Use wet toilet paper or moist towelettes after a bowel movement.   Medicated creams and suppositories may be used or applied as directed.   Only take over-the-counter or prescription medicines as directed by your caregiver.   Take warm sitz baths for 15 20 minutes, 3 4 times a day to ease pain and discomfort.   Place ice packs on the hemorrhoids if they are tender and swollen. Using ice packs between sitz baths may be helpful.   Put ice in a plastic bag.   Place a towel between your skin and the bag.   Leave the ice on for 15 20 minutes, 3 4 times a day.   Do not use a donut-shaped pillow or sit  on the toilet for long periods. This increases blood pooling and pain.  SEEK MEDICAL CARE IF:  You have increasing pain and swelling that is not controlled by treatment or medicine.  You have uncontrolled bleeding.  You have difficulty or you are unable to have a bowel movement.  You have pain or inflammation outside the area of the hemorrhoids. MAKE SURE YOU:  Understand these instructions.  Will watch your condition.  Will get help right away if you are not doing well or get worse. Document Released: 04/26/2000 Document Revised: 04/15/2012 Document Reviewed: 03/03/2012 [facility] Patient Information 2014 [place], [place].   Bloody Stools Bloody stools often mean that there is a problem in the digestive tract. Your caregiver may use the term "melena" to describe black, tarry, and bad smelling stools or "hematochezia"  to describe red or maroon-colored stools. Blood seen in the stool can be caused by bleeding anywhere along the intestinal tract.  A black stool usually means that blood is coming from the upper part of the gastrointestinal tract (esophagus, stomach, or small bowel). Passing maroon-colored stools or bright red blood usually means that blood is coming from lower down in the large bowel or the rectum. However, sometimes massive bleeding in the stomach or small intestine can cause bright red bloody stools.  Consuming black licorice, lead, iron pills, medicines containing bismuth subsalicylate, or blueberries can also cause black stools. Your caregiver can test black stools to see if blood is present. It is important that the cause of the bleeding be found. Treatment can then be started, and the problem can be corrected. Rectal bleeding may not be serious, but you should not assume everything is okay until you know the cause.It is very important to follow up with your caregiver or a specialist in gastrointestinal problems. CAUSES  Blood in the stools can come from various  underlying causes.Often, the cause is not found during your first visit. Testing is often needed to discover the cause of bleeding in the gastrointestinal tract. Causes range from simple to serious or even life-threatening.Possible causes include:  Hemorrhoids.These are veins that are full of blood (engorged) in the rectum. They cause pain, inflammation, and may bleed.  Anal fissures.These are areas of painful tearing which may bleed. They are often caused by passing hard stool.  Diverticulosis.These are pouches that form on the colon over time, with age, and may bleed significantly.  Diverticulitis.This is inflammation in areas with diverticulosis. It can cause pain, fever, and bloody stools, although bleeding is rare.  Proctitis and colitis. These are inflamed areas of the rectum or colon. They may cause pain, fever, and bloody stools.  Polyps and cancer. Colon cancer is a leading cause of preventable cancer death.It often starts out as precancerous polyps that can be removed during a colonoscopy, preventing progression into cancer. Sometimes, polyps and cancer may cause rectal bleeding.  Gastritis and ulcers.Bleeding from the upper gastrointestinal tract (near the stomach) may travel through the intestines and produce black, sometimes tarry, often bad smelling stools. In certain cases, if the bleeding is fast enough, the stools may not be black, but red and the condition may be life-threatening. SYMPTOMS  You may have stools that are bright red and bloody, that are normal color with blood on them, or that are dark black and tarry. In some cases, you may only have blood in the toilet bowl. Any of these cases need medical care. You may also have:  Pain at the anus or anywhere in the rectum.  Lightheadedness or feeling faint.  Extreme weakness.  Nausea or vomiting.  Fever. DIAGNOSIS Your caregiver may use the following methods to find the cause of your bleeding:  Taking a  medical history. Age is important. Older people tend to develop polyps and cancer more often. If there is anal pain and a hard, large stool associated with bleeding, a tear of the anus may be the cause. If blood drips into the toilet after a bowel movement, bleeding hemorrhoids may be the problem. The color and frequency of the bleeding are additional considerations. In most cases, the medical history provides clues, but seldom the final answer.  A visual and finger (digital) exam. Your caregiver will inspect the anal area, looking for tears and hemorrhoids. A finger exam can provide information when there is tenderness or  a growth inside. In men, the prostate is also examined.  Endoscopy. Several types of small, long scopes (endoscopes) are used to view the colon.  In the office, your caregiver may use a rigid, or more commonly, a flexible viewing sigmoidoscope. This exam is called flexible sigmoidoscopy. It is performed in 5 to 10 minutes.  A more thorough exam is accomplished with a colonoscope. It allows your caregiver to view the entire 5 to 6 foot long colon. Medicine to help you relax (sedative) is usually given for this exam. Frequently, a bleeding lesion may be present beyond the reach of the sigmoidoscope. So, a colonoscopy may be the best exam to start with. Both exams are usually done on an outpatient basis. This means the patient does not stay overnight in the hospital or surgery center.  An upper endoscopy may be needed to examine your stomach. Sedation is used and a flexible endoscope is put in your mouth, down to your stomach.  A barium enema X-ray. This is an X-ray exam. It uses liquid barium inserted by enema into the rectum. This test alone may not identify an actual bleeding point. X-rays highlight abnormal shadows, such as those made by lumps (tumors), diverticuli, or colitis. TREATMENT  Treatment depends on the cause of your bleeding.   For bleeding from the stomach or colon,  the caregiver doing your endoscopy or colonoscopy may be able to stop the bleeding as part of the procedure.  Inflammation or infection of the colon can be treated with medicines.  Many rectal problems can be treated with creams, suppositories, or warm baths.  Surgery is sometimes needed.  Blood transfusions are sometimes needed if you have lost a lot of blood.  For any bleeding problem, let your caregiver know if you take aspirin or other blood thinners regularly. HOME CARE INSTRUCTIONS   Take any medicines exactly as prescribed.  Keep your stools soft by eating a diet high in fiber. Prunes (1 to 3 a day) work well for many people.  Drink enough water and fluids to keep your urine clear or pale yellow.  Take sitz baths if advised. A sitz bath is when you sit in a bathtub with warm water for 10 to 15 minutes to soak, soothe, and cleanse the rectal area.  If enemas or suppositories are advised, be sure you know how to use them. Tell your caregiver if you have problems with this.  Monitor your bowel movements to look for signs of improvement or worsening. SEEK MEDICAL CARE IF:   You do not improve in the time expected.  Your condition worsens after initial improvement.  You develop any new symptoms. SEEK IMMEDIATE MEDICAL CARE IF:   You develop severe or prolonged rectal bleeding.  You vomit blood.  You feel weak or faint.  You have a fever. MAKE SURE YOU:  Understand these instructions.  Will watch your condition.  Will get help right away if you are not doing well or get worse. Document Released: 04/19/2002 Document Revised: 07/22/2011 Document Reviewed: 09/14/2010 [facility] Patient Information 2014 [place], [place].

## 2013-08-22 NOTE — ED Provider Notes (Signed)
I spoke to dr Derrell Lollingramirez with surgery Will evaluate patient BP 112/56  Pulse 72  Temp(Src) 98.4 F (36.9 C) (Oral)  Resp 11  Ht 5\' 10"  (1.778 m)  Wt 173 lb (78.472 kg)  BMI 24.82 kg/m2  SpO2 100%   Joya Gaskinsonald W Jamelia Varano, MD 08/22/13 [phone]

## 2013-08-24 ENCOUNTER — Encounter (INDEPENDENT_AMBULATORY_CARE_PROVIDER_SITE_OTHER): Payer: Self-pay

## 2013-08-26 NOTE — ED Provider Notes (Signed)
I saw and evaluated the patient, reviewed the resident's note and I agree with the findings and plan.   .Face to face Exam:  General:  Awake HEENT:  Atraumatic Resp:  Normal effort Abd:  Nondistended Neuro:No focal weakness Lymph: No adenopathy   Nelia Shiobert L Rilie Glanz, MD 08/26/13 1705

## 2013-09-01 ENCOUNTER — Encounter (INDEPENDENT_AMBULATORY_CARE_PROVIDER_SITE_OTHER): Payer: Self-pay | Admitting: General Surgery

## 2013-09-01 ENCOUNTER — Ambulatory Visit (INDEPENDENT_AMBULATORY_CARE_PROVIDER_SITE_OTHER): Payer: Self-pay | Admitting: General Surgery

## 2013-09-01 VITALS — BP 140/80 | HR 77 | Temp 97.4°F | Ht 70.0 in | Wt 167.8 lb

## 2013-09-01 DIAGNOSIS — K460 Unspecified abdominal hernia with obstruction, without gangrene: Secondary | ICD-10-CM

## 2013-09-01 DIAGNOSIS — Z9889 Other specified postprocedural states: Secondary | ICD-10-CM

## 2013-09-01 DIAGNOSIS — Z9049 Acquired absence of other specified parts of digestive tract: Secondary | ICD-10-CM

## 2013-09-01 MED ORDER — OXYCODONE-ACETAMINOPHEN 5-325 MG PO TABS: 1.0000 | ORAL_TABLET | Freq: Four times a day (QID) | ORAL | Status: DC | PRN

## 2013-09-01 NOTE — Progress Notes (Signed)
Subjective:     Patient ID: Mitchell [name], male   DOB: 12-24-1985, 28 y.o.   MRN: [id number]  HPI Patient presents status post arthroscopic appendectomy. Postoperatively, he developed an incarcerated hernia at his left lower quadrant port site requiring small bowel repair. He is doing well since discharge. He is eating and moving his bowels without difficulty. He does request refill of his pain medicine but he is taking ibuprofen in the interim. He usually was taken the Percocet just twice a day. Using wet to dries on his wound. Review of Systems     Objective:   Physical Exam  Abdomen is soft and nontender. Right and umbilical port sites are well-healed. Left lower quadrant incision site has clean granulation tissue. A new wet-to-dry dressing was placed. No evidence of infection.    Assessment:     Coming along well status post arthroscopic appendectomy and subsequent emergent hernia repair    Plan:     I refilled his Percocet. I will see him back in about 3 weeks. Continue wet-to-dry dressings.

## 2013-09-02 ENCOUNTER — Encounter (INDEPENDENT_AMBULATORY_CARE_PROVIDER_SITE_OTHER): Payer: Self-pay | Admitting: General Surgery

## 2013-09-22 ENCOUNTER — Ambulatory Visit (INDEPENDENT_AMBULATORY_CARE_PROVIDER_SITE_OTHER): Payer: Self-pay | Admitting: General Surgery

## 2013-09-22 VITALS — BP 118/74 | HR 72 | Temp 98.0°F | Resp 18 | Ht 70.0 in | Wt 168.0 lb

## 2013-09-22 DIAGNOSIS — K460 Unspecified abdominal hernia with obstruction, without gangrene: Secondary | ICD-10-CM

## 2013-09-22 DIAGNOSIS — Z9889 Other specified postprocedural states: Secondary | ICD-10-CM

## 2013-09-22 DIAGNOSIS — Z9049 Acquired absence of other specified parts of digestive tract: Secondary | ICD-10-CM

## 2013-09-22 MED ORDER — OXYCODONE-ACETAMINOPHEN 5-325 MG PO TABS: 1.0000 | ORAL_TABLET | Freq: Four times a day (QID) | ORAL | Status: DC | PRN

## 2013-09-22 NOTE — Progress Notes (Signed)
Subjective:     Patient ID: Mitchell MessickBrian D [name], male   DOB: 1986-02-07, 28 y.o.   MRN: [id number]  HPI Patient status post lap appendectomy with subsequent postoperative incarcerated hernia left lower quadrant. He feels his wound is nearly healed. He still has some pain and occasionally takes pain medication. He has been limiting his activities.  Review of Systems     Objective:   Physical Exam Abdomen soft nontender, small stitch was not removed from right-sided port site. Umbilical wound healed. Left lower quadrant wound is nearly healed, small amount of heaped up granulation tissue is treated with silver nitrate. Small dressing was applied.    Assessment:     Improved    Plan:     Small refill of Percocet which she will gradually wean off. Resume lifting a full 6 weeks after surgery. Return when necessary.

## 2014-02-15 ENCOUNTER — Emergency Department (HOSPITAL_COMMUNITY): Payer: Self-pay

## 2014-02-15 ENCOUNTER — Encounter (HOSPITAL_COMMUNITY): Payer: Self-pay | Admitting: Emergency Medicine

## 2014-02-15 ENCOUNTER — Emergency Department (HOSPITAL_COMMUNITY)
Admission: EM | Admit: 2014-02-15 | Discharge: 2014-02-15 | Disposition: A | Discharge status: Home / Self Care | Payer: Self-pay | Attending: Emergency Medicine | Admitting: Emergency Medicine

## 2014-02-15 DIAGNOSIS — Z8719 Personal history of other diseases of the digestive system: Secondary | ICD-10-CM | POA: Insufficient documentation

## 2014-02-15 DIAGNOSIS — Z720 Tobacco use: Secondary | ICD-10-CM | POA: Insufficient documentation

## 2014-02-15 DIAGNOSIS — K5900 Constipation, unspecified: Secondary | ICD-10-CM | POA: Insufficient documentation

## 2014-02-15 DIAGNOSIS — R1084 Generalized abdominal pain: Secondary | ICD-10-CM | POA: Insufficient documentation

## 2014-02-15 LAB — COMPREHENSIVE METABOLIC PANEL
ALK PHOS: 83 U/L (ref 39–117)
ALT: 16 U/L (ref 0–53)
AST: 31 U/L (ref 0–37)
Albumin: 3.9 g/dL (ref 3.5–5.2)
Anion gap: 10 (ref 5–15)
BUN: 12 mg/dL (ref 6–23)
CALCIUM: 9.4 mg/dL (ref 8.4–10.5)
CO2: 31 mEq/L (ref 19–32)
Chloride: 99 mEq/L (ref 96–112)
Creatinine, Ser: 1.09 mg/dL (ref 0.50–1.35)
GFR calc Af Amer: >90 mL/min (ref >90)
GFR calc non Af Amer: >90 mL/min (ref >90)
GLUCOSE: 114 mg/dL — AB (ref 70–99)
POTASSIUM: 3.8 meq/L (ref 3.7–5.3)
SODIUM: 140 meq/L (ref 137–147)
TOTAL PROTEIN: 7.3 g/dL (ref 6.0–8.3)
Total Bilirubin: 0.6 mg/dL (ref 0.3–1.2)

## 2014-02-15 LAB — CBC WITH DIFFERENTIAL/PLATELET
BASOS ABS: 0 10*3/uL (ref 0.0–0.1)
Basophils Relative: 0 % (ref 0–1)
EOS PCT: 6 % — AB (ref 0–5)
Eosinophils Absolute: 0.5 10*3/uL (ref 0.0–0.7)
HCT: 42.7 % (ref 39.0–52.0)
Hemoglobin: 15.1 g/dL (ref 13.0–17.0)
LYMPHS ABS: 2.4 10*3/uL (ref 0.7–4.0)
Lymphocytes Relative: 30 % (ref 12–46)
MCH: 30.8 pg (ref 26.0–34.0)
MCHC: 35.4 g/dL (ref 30.0–36.0)
MCV: 87.1 fL (ref 78.0–100.0)
Monocytes Absolute: 0.6 10*3/uL (ref 0.1–1.0)
Monocytes Relative: 8 % (ref 3–12)
NEUTROS PCT: 56 % (ref 43–77)
Neutro Abs: 4.3 10*3/uL (ref 1.7–7.7)
PLATELETS: 202 10*3/uL (ref 150–400)
RBC: 4.9 MIL/uL (ref 4.22–5.81)
RDW: 13.1 % (ref 11.5–15.5)
WBC: 7.8 10*3/uL (ref 4.0–10.5)

## 2014-02-15 LAB — URINALYSIS, ROUTINE W REFLEX MICROSCOPIC
Bilirubin Urine: NEGATIVE
Glucose, UA: NEGATIVE mg/dL
HGB URINE DIPSTICK: NEGATIVE
Ketones, ur: NEGATIVE mg/dL
Leukocytes, UA: NEGATIVE
Nitrite: NEGATIVE
Protein, ur: NEGATIVE mg/dL
UROBILINOGEN UA: 0.2 mg/dL (ref 0.0–1.0)
pH: 7 (ref 5.0–8.0)

## 2014-02-15 MED ORDER — ONDANSETRON HCL 4 MG/2ML IJ SOLN
4.0000 mg | Freq: Once | INTRAMUSCULAR | Status: AC
  Administered 2014-02-15: 4 mg via INTRAVENOUS
  Filled 2014-02-15: qty 2

## 2014-02-15 MED ORDER — MORPHINE SULFATE 4 MG/ML IJ SOLN
4.0000 mg | Freq: Once | INTRAMUSCULAR | Status: AC
  Administered 2014-02-15: 4 mg via INTRAVENOUS
  Filled 2014-02-15: qty 1

## 2014-02-15 MED ORDER — BISACODYL 10 MG RE SUPP
10.0000 mg | Freq: Once | RECTAL | Status: DC
  Filled 2014-02-15: qty 1

## 2014-02-15 MED ORDER — IOHEXOL 300 MG/ML  SOLN
100.0000 mL | Freq: Once | INTRAMUSCULAR | Status: AC | PRN
  Administered 2014-02-15: 100 mL via INTRAVENOUS

## 2014-02-15 MED ORDER — SODIUM CHLORIDE 0.9 % IV SOLN
1000.0000 mL | INTRAVENOUS | Status: DC
Start: 2014-02-15 — End: 2014-02-16
  Administered 2014-02-15: 1000 mL via INTRAVENOUS

## 2014-02-15 MED ORDER — SODIUM CHLORIDE 0.9 % IV SOLN
1000.0000 mL | Freq: Once | INTRAVENOUS | Status: AC
  Administered 2014-02-15: 1000 mL via INTRAVENOUS

## 2014-02-15 MED ORDER — IOHEXOL 300 MG/ML  SOLN
50.0000 mL | Freq: Once | INTRAMUSCULAR | Status: AC | PRN
  Administered 2014-02-15: 50 mL via ORAL

## 2014-02-15 NOTE — Discharge Instructions (Signed)
Get miralax and put one dose or 17 g in 8 ounces of water,  take 1 dose every 30 minutes for 2-3 hours or until you get good results and then once or twice daily to prevent constipation. If you continue to have pain consider being evaluated again at [facility], they can see you in the afternoon if you call in the morning. You could also be evaluated by a gastroenterologist   Constipation Constipation is when a person has fewer than three bowel movements a week, has difficulty having a bowel movement, or has stools that are dry, hard, or larger than normal. As people grow older, constipation is more common. If you try to fix constipation with medicines that make you have a bowel movement (laxatives), the problem may get worse. Long-term laxative use may cause the muscles of the colon to become weak. A low-fiber diet, not taking in enough fluids, and taking certain medicines may make constipation worse.  CAUSES   Certain medicines, such as antidepressants, pain medicine, iron supplements, antacids, and water pills.   Certain diseases, such as diabetes, irritable bowel syndrome (IBS), thyroid disease, or depression.   Not drinking enough water.   Not eating enough fiber-rich foods.   Stress or travel.   Lack of physical activity or exercise.   Ignoring the urge to have a bowel movement.   Using laxatives too much.  SIGNS AND SYMPTOMS   Having fewer than three bowel movements a week.   Straining to have a bowel movement.   Having stools that are hard, dry, or larger than normal.   Feeling full or bloated.   Pain in the lower abdomen.   Not feeling relief after having a bowel movement.  DIAGNOSIS  Your health care provider will take a medical history and perform a physical exam. Further testing may be done for severe constipation. Some tests may include:  A barium enema X-ray to examine your rectum, colon, and, sometimes, your small intestine.   A  sigmoidoscopy to examine your lower colon.   A colonoscopy to examine your entire colon. TREATMENT  Treatment will depend on the severity of your constipation and what is causing it. Some dietary treatments include drinking more fluids and eating more fiber-rich foods. Lifestyle treatments may include regular exercise. If these diet and lifestyle recommendations do not help, your health care provider may recommend taking over-the-counter laxative medicines to help you have bowel movements. Prescription medicines may be prescribed if over-the-counter medicines do not work.  HOME CARE INSTRUCTIONS   Eat foods that have a lot of fiber, such as fruits, vegetables, whole grains, and beans.  Limit foods high in fat and processed sugars, such as french fries, hamburgers, cookies, candies, and soda.   A fiber supplement may be added to your diet if you cannot get enough fiber from foods.   Drink enough fluids to keep your urine clear or pale yellow.   Exercise regularly or as directed by your health care provider.   Go to the restroom when you have the urge to go. Do not hold it.   Only take over-the-counter or prescription medicines as directed by your health care provider. Do not take other medicines for constipation without talking to your health care provider first.  SEEK IMMEDIATE MEDICAL CARE IF:   You have bright red blood in your stool.   Your constipation lasts for more than 4 days or gets worse.   You have abdominal or rectal pain.   You  have thin, pencil-like stools.   You have unexplained weight loss. MAKE SURE YOU:   Understand these instructions.  Will watch your condition.  Will get help right away if you are not doing well or get worse. Document Released: 01/26/2004 Document Revised: 05/04/2013 Document Reviewed: 02/08/2013 [facility] Patient Information 2015 [place], [place]. This information is not intended to replace advice given to you by your health care  provider. Make sure you discuss any questions you have with your health care provider. , Dr Jena Gaussourk is on call tonight, call his office for an appointment.

## 2014-02-15 NOTE — ED Notes (Signed)
Pt states he abdominal pain began at ~0200. Denies V/D. LBM was this morning. Pt states hx of blockage.

## 2014-02-15 NOTE — ED Provider Notes (Signed)
CSN: [id number]     Arrival date & time 02/15/14  1746 History   First MD Initiated Contact with Patient 02/15/14 1759     Chief Complaint  Patient presents with  . Abdominal Pain     (Consider location/radiation/quality/duration/timing/severity/associated sxs/prior Treatment) HPI Patient had an appendectomy done on April 3 followed by a incarcerated abdominal wall hernia repair 3 days later on the left. He reports he woke up about 1 AM this morning and has had abdominal pain that moves around his abdomen all day. He states the pain does not stay in one place. He states the pains feel like a gas pain and he describes them as sharp. He also feels like his abdomen is swollen and bloated. He denies any nausea or vomiting and states he tried to even sandwich and chips about 1-2 hours ago and he was only able eat half. He states he had a small bowel movement this morning. He does not feel like he has to have a BM or pass gas and can't. He states this feels just like when he had the blockage after his initial appendectomy.  PCP none [facility]  Past Medical History  Diagnosis Date  . Appendicitis    Past Surgical History  Procedure Laterality Date  . Laparoscopic appendectomy N/A 08/13/2013    Procedure: APPENDECTOMY LAPAROSCOPIC;  Surgeon: Liz Malady, MD;  Location: [facility] OR;  Service: General;  Laterality: N/A;  . Laparotomy N/A 08/16/2013    Procedure: INCARCERATED ABDOMINAL WALL HERNIA REPAIR;  Surgeon: Cherylynn Ridges, MD;  Location: [facility] OR;  Service: General;  Laterality: N/A;   No family history on file. History  Substance Use Topics  . Smoking status: Current Every Day Smoker -- 1.00 packs/day    Types: Cigarettes  . Smokeless tobacco: Not on file  . Alcohol Use: Yes  employed Smokes 1/2 ppd Denies ETOH  Review of Systems  All other systems reviewed and are negative.     Allergies  Review of patient's allergies indicates no known allergies.  Home  Medications   Prior to Admission medications   Not on File   BP 121/89  Pulse 61  Temp(Src) 98.7 F (37.1 C) (Oral)  Resp 20  Ht 5\' 11"  (1.803 m)  Wt 170 lb (77.111 kg)  BMI 23.72 kg/m2  SpO2 99%  Vital signs normal   Physical Exam  Nursing note and vitals reviewed. Constitutional: He is oriented to person, place, and time. He appears well-developed and well-nourished.  Non-toxic appearance. He does not appear ill. He appears distressed.  HENT:  Head: Normocephalic and atraumatic.  Right Ear: External ear normal.  Left Ear: External ear normal.  Nose: Nose normal. No mucosal edema or rhinorrhea.  Mouth/Throat: Oropharynx is clear and moist and mucous membranes are normal. No dental abscesses or uvula swelling.  Eyes: Conjunctivae and EOM are normal. Pupils are equal, round, and reactive to light.  Neck: Normal range of motion and full passive range of motion without pain. Neck supple.  Cardiovascular: Normal rate, regular rhythm and normal heart sounds.  Exam reveals no gallop and no friction rub.   No murmur heard. Pulmonary/Chest: Effort normal and breath sounds normal. No respiratory distress. He has no wheezes. He has no rhonchi. He has no rales. He exhibits no tenderness and no crepitus.  Abdominal: Soft. Normal appearance and bowel sounds are normal. He exhibits distension. There is no rebound and no guarding.    Patient has distention of his abdomen.  He has a large linear horizontally placed scar in his left lower quadrant where he states he had a hernia repair. There is no bulging of the scar. There are no hernias seen on the right. He has scattered bowel sounds diffusely. He's tender diffusely without localization.  Musculoskeletal: Normal range of motion. He exhibits no edema and no tenderness.  Moves all extremities well.   Neurological: He is alert and oriented to person, place, and time. He has normal strength. No cranial nerve deficit.  Skin: Skin is warm, dry and  intact. No rash noted. No erythema. No pallor.  Psychiatric: He has a normal mood and affect. His speech is normal and behavior is normal. His mood appears not anxious.    ED Course  Procedures (including critical care time)  Medications  0.9 %  sodium chloride infusion (0 mLs Intravenous Stopped 02/15/14 2011)    Followed by  0.9 %  sodium chloride infusion (1,000 mLs Intravenous New Bag/Given 02/15/14 2015)  bisacodyl (DULCOLAX) suppository 10 mg (10 mg Rectal Not Given 02/15/14 2146)  morphine 4 MG/ML injection 4 mg (4 mg Intravenous Given 02/15/14 1851)  ondansetron (ZOFRAN) injection 4 mg (4 mg Intravenous Given 02/15/14 1851)  morphine 4 MG/ML injection 4 mg (4 mg Intravenous Given 02/15/14 2015)  iohexol (OMNIPAQUE) 300 MG/ML solution 50 mL (50 mLs Oral Contrast Given 02/15/14 2024)  iohexol (OMNIPAQUE) 300 MG/ML solution 100 mL (100 mLs Intravenous Contrast Given 02/15/14 2024)  ondansetron (ZOFRAN) injection 4 mg (4 mg Intravenous Given 02/15/14 2044)    Review of patient's surgeries in April shows he had acute appendicitis and then had a perforation from the laparoscopic procedure that was repaired and he had a hernia in the same area.  Patient was given his test results. I have reviewed his CT scan which and he has a lot of stool in his right colon. Patient is advised to do a MiraLAX regimen. He states he is not sure if he wants a Dulcolax suppository because last time he had one  he felt it gave him hemorrhoids.   Labs Review Results for orders placed during the hospital encounter of 02/15/14  CBC WITH DIFFERENTIAL      Result Value Ref Range   WBC 7.8  4.0 - 10.5 K/uL   RBC 4.90  4.22 - 5.81 MIL/uL   Hemoglobin 15.1  13.0 - 17.0 g/dL   HCT 16.142.7  09.639.0 - 04.552.0 %   MCV 87.1  78.0 - 100.0 fL   MCH 30.8  26.0 - 34.0 pg   MCHC 35.4  30.0 - 36.0 g/dL   RDW 40.913.1  81.111.5 - 91.415.5 %   Platelets 202  150 - 400 K/uL   Neutrophils Relative % 56  43 - 77 %   Neutro Abs 4.3  1.7 - 7.7 K/uL    Lymphocytes Relative 30  12 - 46 %   Lymphs Abs 2.4  0.7 - 4.0 K/uL   Monocytes Relative 8  3 - 12 %   Monocytes Absolute 0.6  0.1 - 1.0 K/uL   Eosinophils Relative 6 (*) 0 - 5 %   Eosinophils Absolute 0.5  0.0 - 0.7 K/uL   Basophils Relative 0  0 - 1 %   Basophils Absolute 0.0  0.0 - 0.1 K/uL  COMPREHENSIVE METABOLIC PANEL      Result Value Ref Range   Sodium 140  137 - 147 mEq/L   Potassium 3.8  3.7 - 5.3 mEq/L   Chloride 99  Trixie DredgeLathamParamedicreta AveDorna Dorrene GermanmGaetRevonda StandardcoichaelisSebaSanjuHosie Poisson Kav TTAG>TAG>[facility] acedoniaFirst Surgical [facility]lands LPMarland Kitchen  Trixie DredgePicnic PointParamedic Nelva NayHosie PoissonSanjuana KavaSallee LangeHoy FinlaySebastian AcheRevonda StandardSunocoDorrene GermanLoreta AveDorna BloomGaetana MichaelisPenn State Hershey Rehabilitation HospitalBarrie Folk [facility] CenterGeorgiaMed City [facility] MacedoniaHouston Methodist HosptialMarland Kitchen  Trixie DredgeYoungstownParamedic Nelva NayHosie PoissonSanjuana KavaSallee LangeHoy FinlaySebastian AcheRevonda StandardSunocoDorrene GermanLoreta AveDorna BloomGaetana MichaelisRock SpringsBarrie Folk  Final diagnoses:  Generalized abdominal pain  Constipation, unspecified constipation type   meds OTC miralax  Plan discharge  Devoria Albe, MD, Franz Dell,  MD 02/15/14 2153

## 2014-02-15 NOTE — ED Notes (Signed)
Pt refused enema suppository, so I informed him he needs to purchase something for constipation when he leaves her to help with abd pain.

## 2014-04-19 ENCOUNTER — Observation Stay: Payer: Self-pay | Admitting: Internal Medicine

## 2014-04-19 ENCOUNTER — Emergency Department: Payer: Self-pay | Admitting: Emergency Medicine

## 2014-04-19 LAB — DRUG SCREEN, URINE

## 2014-04-19 LAB — COMPREHENSIVE METABOLIC PANEL
ALK PHOS: 88 U/L
ALT: 22 U/L
AST: 41 U/L — AB (ref 15–37)
Albumin: 4.2 g/dL (ref 3.4–5.0)
Anion Gap: 7 (ref 7–16)
BUN: 6 mg/dL — ABNORMAL LOW (ref 7–18)
Bilirubin,Total: 0.4 mg/dL (ref 0.2–1.0)
CHLORIDE: 106 mmol/L (ref 98–107)
Calcium, Total: 8.9 mg/dL (ref 8.5–10.1)
Co2: 28 mmol/L (ref 21–32)
Creatinine: 1.12 mg/dL (ref 0.60–1.30)
EGFR (African American): >60
EGFR (Non-African Amer.): >60
Glucose: 92 mg/dL (ref 65–99)
Osmolality: 279 (ref 275–301)
POTASSIUM: 4.5 mmol/L (ref 3.5–5.1)
SODIUM: 141 mmol/L (ref 136–145)
Total Protein: 8 g/dL (ref 6.4–8.2)

## 2014-04-19 LAB — CARBAMAZEPINE LEVEL, TOTAL
Carbamazepine: 2 ug/mL — ABNORMAL LOW (ref 4.0–12.0)
Carbamazepine: 26.6 ug/mL (ref 4.0–12.0)
Carbamazepine: 27.8 ug/mL

## 2014-04-19 LAB — CBC
HCT: 44.2 % (ref 40.0–52.0)
HCT: 41.6 % (ref 40.0–52.0)
HGB: 14.9 g/dL (ref 13.0–18.0)
HGB: 13.9 g/dL (ref 13.0–18.0)
MCH: 31.4 pg (ref 26.0–34.0)
MCH: 30.9 pg (ref 26.0–34.0)
MCHC: 33.7 g/dL (ref 32.0–36.0)
MCHC: 33.4 g/dL (ref 32.0–36.0)
MCV: 93 fL (ref 80–100)
MCV: 93 fL (ref 80–100)
PLATELETS: 191 10*3/uL (ref 150–440)
Platelet: 201 10*3/uL (ref 150–440)
RBC: 4.75 10*6/uL (ref 4.40–5.90)
RBC: 4.5 10*6/uL (ref 4.40–5.90)
RDW: 13.5 % (ref 11.5–14.5)
RDW: 13.3 % (ref 11.5–14.5)
WBC: 9.2 10*3/uL (ref 3.8–10.6)
WBC: 7.4 10*3/uL (ref 3.8–10.6)

## 2014-04-19 LAB — URINALYSIS, COMPLETE
BILIRUBIN, UR: NEGATIVE
Bacteria: NONE SEEN
Blood: NEGATIVE
Glucose,UR: NEGATIVE mg/dL (ref 0–75)
Ketone: NEGATIVE
Leukocyte Esterase: NEGATIVE
NITRITE: NEGATIVE
PROTEIN: NEGATIVE
Ph: 6 (ref 4.5–8.0)
RBC, UR: NONE SEEN /HPF (ref 0–5)
SPECIFIC GRAVITY: 1.009 (ref 1.003–1.030)
Squamous Epithelial: NONE SEEN
WBC UR: <1 /HPF (ref 0–5)

## 2014-04-19 LAB — BASIC METABOLIC PANEL
ANION GAP: 7 (ref 7–16)
BUN: 6 mg/dL — ABNORMAL LOW (ref 7–18)
CALCIUM: 8.3 mg/dL — AB (ref 8.5–10.1)
CO2: 32 mmol/L (ref 21–32)
Chloride: 103 mmol/L (ref 98–107)
Creatinine: 1.06 mg/dL (ref 0.60–1.30)
EGFR (Non-African Amer.): >60
Glucose: 101 mg/dL — ABNORMAL HIGH (ref 65–99)
Osmolality: 281 (ref 275–301)
POTASSIUM: 3.5 mmol/L (ref 3.5–5.1)
Sodium: 142 mmol/L (ref 136–145)

## 2014-04-19 LAB — SALICYLATE LEVEL: Salicylates, Serum: <1.7 mg/dL

## 2014-04-19 LAB — TROPONIN I

## 2014-04-19 LAB — ETHANOL
Ethanol: 170 mg/dL
Ethanol: <3 mg/dL

## 2014-04-19 LAB — ACETAMINOPHEN LEVEL: Acetaminophen: <2 ug/mL

## 2014-04-19 LAB — TSH: Thyroid Stimulating Horm: 1.5 u[IU]/mL

## 2014-04-20 LAB — COMPREHENSIVE METABOLIC PANEL
Albumin: 3.7 g/dL (ref 3.4–5.0)
Alkaline Phosphatase: 76 U/L
Anion Gap: 6 — ABNORMAL LOW (ref 7–16)
BILIRUBIN TOTAL: 0.7 mg/dL (ref 0.2–1.0)
BUN: 8 mg/dL (ref 7–18)
CALCIUM: 8.2 mg/dL — AB (ref 8.5–10.1)
CHLORIDE: 106 mmol/L (ref 98–107)
Co2: 29 mmol/L (ref 21–32)
Creatinine: 1.11 mg/dL (ref 0.60–1.30)
EGFR (African American): >60
EGFR (Non-African Amer.): >60
Glucose: 86 mg/dL (ref 65–99)
Osmolality: 279 (ref 275–301)
Potassium: 3.6 mmol/L (ref 3.5–5.1)
SGOT(AST): 27 U/L (ref 15–37)
SGPT (ALT): 19 U/L
SODIUM: 141 mmol/L (ref 136–145)
TOTAL PROTEIN: 6.7 g/dL (ref 6.4–8.2)

## 2014-04-20 LAB — CARBAMAZEPINE LEVEL, TOTAL
Carbamazepine: 25.9 ug/mL (ref 4.0–12.0)
Carbamazepine: 21.7 ug/mL (ref 4.0–12.0)

## 2014-04-21 LAB — CARBAMAZEPINE LEVEL, TOTAL: CARBAMAZEPINE: 17.5 ug/mL — AB (ref 4.0–12.0)

## 2014-06-14 ENCOUNTER — Emergency Department (HOSPITAL_COMMUNITY): Payer: Self-pay

## 2014-06-14 ENCOUNTER — Emergency Department (HOSPITAL_COMMUNITY)
Admission: EM | Admit: 2014-06-14 | Discharge: 2014-06-14 | Disposition: A | Discharge status: Home / Self Care | Payer: Self-pay | Attending: Emergency Medicine | Admitting: Emergency Medicine

## 2014-06-14 ENCOUNTER — Encounter (HOSPITAL_COMMUNITY): Payer: Self-pay | Admitting: Emergency Medicine

## 2014-06-14 DIAGNOSIS — Z9049 Acquired absence of other specified parts of digestive tract: Secondary | ICD-10-CM | POA: Insufficient documentation

## 2014-06-14 DIAGNOSIS — R52 Pain, unspecified: Secondary | ICD-10-CM

## 2014-06-14 DIAGNOSIS — R109 Unspecified abdominal pain: Secondary | ICD-10-CM | POA: Insufficient documentation

## 2014-06-14 DIAGNOSIS — Z79899 Other long term (current) drug therapy: Secondary | ICD-10-CM | POA: Insufficient documentation

## 2014-06-14 DIAGNOSIS — Z720 Tobacco use: Secondary | ICD-10-CM | POA: Insufficient documentation

## 2014-06-14 LAB — CBC WITH DIFFERENTIAL/PLATELET
BASOS ABS: 0 10*3/uL (ref 0.0–0.1)
BASOS PCT: 1 % (ref 0–1)
EOS PCT: 3 % (ref 0–5)
Eosinophils Absolute: 0.2 10*3/uL (ref 0.0–0.7)
HCT: 38.2 % — ABNORMAL LOW (ref 39.0–52.0)
Hemoglobin: 13.4 g/dL (ref 13.0–17.0)
LYMPHS ABS: 1.4 10*3/uL (ref 0.7–4.0)
Lymphocytes Relative: 20 % (ref 12–46)
MCH: 30.7 pg (ref 26.0–34.0)
MCHC: 35.1 g/dL (ref 30.0–36.0)
MCV: 87.4 fL (ref 78.0–100.0)
MONOS PCT: 9 % (ref 3–12)
Monocytes Absolute: 0.7 10*3/uL (ref 0.1–1.0)
NEUTROS ABS: 4.7 10*3/uL (ref 1.7–7.7)
NEUTROS PCT: 67 % (ref 43–77)
Platelets: 193 10*3/uL (ref 150–400)
RBC: 4.37 MIL/uL (ref 4.22–5.81)
RDW: 12.3 % (ref 11.5–15.5)
WBC: 7.1 10*3/uL (ref 4.0–10.5)

## 2014-06-14 LAB — COMPREHENSIVE METABOLIC PANEL
ALT: 21 U/L (ref 0–53)
AST: 27 U/L (ref 0–37)
Albumin: 4.2 g/dL (ref 3.5–5.2)
Alkaline Phosphatase: 80 U/L (ref 39–117)
Anion gap: 6 (ref 5–15)
BUN: 11 mg/dL (ref 6–23)
CALCIUM: 9 mg/dL (ref 8.4–10.5)
CHLORIDE: 102 mmol/L (ref 96–112)
CO2: 30 mmol/L (ref 19–32)
CREATININE: 0.85 mg/dL (ref 0.50–1.35)
GLUCOSE: 81 mg/dL (ref 70–99)
Potassium: 3.6 mmol/L (ref 3.5–5.1)
Sodium: 138 mmol/L (ref 135–145)
Total Bilirubin: 1.2 mg/dL (ref 0.3–1.2)
Total Protein: 7.2 g/dL (ref 6.0–8.3)

## 2014-06-14 LAB — LIPASE, BLOOD: LIPASE: 21 U/L (ref 11–59)

## 2014-06-14 MED ORDER — SODIUM CHLORIDE 0.9 % IJ SOLN
INTRAMUSCULAR | Status: AC
  Administered 2014-06-14: 07:00:00
  Filled 2014-06-14: qty 500

## 2014-06-14 MED ORDER — SODIUM CHLORIDE 0.9 % IV BOLUS (SEPSIS)
1000.0000 mL | Freq: Once | INTRAVENOUS | Status: AC
  Administered 2014-06-14: 1000 mL via INTRAVENOUS

## 2014-06-14 MED ORDER — PANTOPRAZOLE SODIUM 40 MG IV SOLR
40.0000 mg | Freq: Once | INTRAVENOUS | Status: AC
  Administered 2014-06-14: 40 mg via INTRAVENOUS
  Filled 2014-06-14: qty 40

## 2014-06-14 MED ORDER — IOHEXOL 300 MG/ML  SOLN
50.0000 mL | Freq: Once | INTRAMUSCULAR | Status: AC | PRN
  Administered 2014-06-14: 50 mL via ORAL

## 2014-06-14 MED ORDER — IOHEXOL 300 MG/ML  SOLN
100.0000 mL | Freq: Once | INTRAMUSCULAR | Status: AC | PRN
  Administered 2014-06-14: 100 mL via INTRAVENOUS

## 2014-06-14 MED ORDER — HYDROMORPHONE HCL 1 MG/ML IJ SOLN
1.0000 mg | Freq: Once | INTRAMUSCULAR | Status: AC
  Administered 2014-06-14: 1 mg via INTRAVENOUS
  Filled 2014-06-14: qty 1

## 2014-06-14 MED ORDER — HYDROCODONE-ACETAMINOPHEN 5-325 MG PO TABS: 1.0000 | ORAL_TABLET | Freq: Four times a day (QID) | ORAL | Status: DC | PRN

## 2014-06-14 MED ORDER — ONDANSETRON HCL 4 MG/2ML IJ SOLN
4.0000 mg | Freq: Once | INTRAMUSCULAR | Status: AC
  Administered 2014-06-14: 4 mg via INTRAVENOUS
  Filled 2014-06-14: qty 2

## 2014-06-14 MED ORDER — RANITIDINE HCL 150 MG PO CAPS: 150.0000 mg | ORAL_CAPSULE | Freq: Every day | ORAL | Status: DC

## 2014-06-14 MED ORDER — PROMETHAZINE HCL 25 MG PO TABS: 25.0000 mg | ORAL_TABLET | Freq: Four times a day (QID) | ORAL | Status: DC | PRN

## 2014-06-14 NOTE — ED Notes (Signed)
Pt c/o abd pain all night

## 2014-06-14 NOTE — ED Notes (Signed)
Pt in CT.

## 2014-06-14 NOTE — Discharge Instructions (Signed)
Follow up with DR. Rehmann or another stomach specialist in 1-2 weeks

## 2014-06-14 NOTE — ED Provider Notes (Signed)
CSN: [id number]     Arrival date & time 06/14/14  0556 History   First MD Initiated Contact with Patient 06/14/14 [phone]     Chief Complaint  Patient presents with  . Abdominal Pain     (Consider location/radiation/quality/duration/timing/severity/associated sxs/prior Treatment) Patient is a 29 y.o. male presenting with abdominal pain. The history is provided by the patient (the pt complains of upper abd pain).  Abdominal Pain Pain location:  Epigastric Pain quality: aching   Pain radiates to:  Does not radiate Pain severity:  Moderate Onset quality:  Gradual Timing:  Constant Progression:  Waxing and waning Chronicity:  Recurrent Context: not awakening from sleep   Associated symptoms: no chest pain, no cough, no diarrhea, no fatigue and no hematuria     Past Medical History  Diagnosis Date  . Appendicitis    Past Surgical History  Procedure Laterality Date  . Laparoscopic appendectomy N/A 08/13/2013    Procedure: APPENDECTOMY LAPAROSCOPIC;  Surgeon: Liz Malady, MD;  Location: [facility] OR;  Service: General;  Laterality: N/A;  . Laparotomy N/A 08/16/2013    Procedure: INCARCERATED ABDOMINAL WALL HERNIA REPAIR;  Surgeon: Cherylynn Ridges, MD;  Location: [facility] OR;  Service: General;  Laterality: N/A;   History reviewed. No pertinent family history. History  Substance Use Topics  . Smoking status: Current Every Day Smoker -- 1.00 packs/day    Types: Cigarettes  . Smokeless tobacco: Not on file  . Alcohol Use: Yes    Review of Systems  Constitutional: Negative for appetite change and fatigue.  HENT: Negative for congestion, ear discharge and sinus pressure.   Eyes: Negative for discharge.  Respiratory: Negative for cough.   Cardiovascular: Negative for chest pain.  Gastrointestinal: Positive for abdominal pain. Negative for diarrhea.  Genitourinary: Negative for frequency and hematuria.  Musculoskeletal: Negative for back pain.  Skin: Negative for rash.  Neurological: Negative  for seizures and headaches.  Psychiatric/Behavioral: Negative for hallucinations.      Allergies  Review of patient's allergies indicates no known allergies.  Home Medications   Prior to Admission medications   Medication Sig Start Date End Date Taking? Authorizing Provider  HYDROcodone-acetaminophen (NORCO/VICODIN) 5-325 MG per tablet Take 1 tablet by mouth every 6 (six) hours as needed for moderate pain. 06/14/14   Benny Lennert, MD  promethazine (PHENERGAN) 25 MG tablet Take 1 tablet (25 mg total) by mouth every 6 (six) hours as needed for nausea or vomiting. 06/14/14   Benny Lennert, MD  ranitidine (ZANTAC) 150 MG capsule Take 1 capsule (150 mg total) by mouth daily. 06/14/14   Benny Lennert, MD   BP 140/77 mmHg  Pulse 91  Temp(Src) 98.2 F (36.8 C)  Resp 18  Ht  (1.778 m)  Wt 160 lb (72.576 kg)  BMI 22.96 kg/m2  SpO2 94% Physical Exam  Constitutional: He is oriented to person, place, and time. He appears well-developed.  HENT:  Head: Normocephalic.  Eyes: Conjunctivae and EOM are normal. No scleral icterus.  Neck: Neck supple. No thyromegaly present.  Cardiovascular: Normal rate and regular rhythm.  Exam reveals no gallop and no friction rub.   No murmur heard. Pulmonary/Chest: No stridor. He has no wheezes. He has no rales. He exhibits no tenderness.  Abdominal: He exhibits no distension. There is tenderness. There is no rebound.  Tender mod.  epigastric  Musculoskeletal: Normal range of motion. He exhibits no edema.  Lymphadenopathy:    He has no cervical adenopathy.  Neurological: He is  oriented to person, place, and time. He exhibits normal muscle tone. Coordination normal.  Skin: No rash noted. No erythema.  Psychiatric: He has a normal mood and affect. His behavior is normal.    ED Course  Procedures (including critical care time) Labs Review Labs Reviewed  CBC WITH DIFFERENTIAL/PLATELET - Abnormal; Notable for the following:    HCT 38.2 (*)    All  other components within normal limits  LIPASE, BLOOD  COMPREHENSIVE METABOLIC PANEL    Imaging Review Ct Abdomen Pelvis W Contrast  06/14/2014   CLINICAL DATA:  Abdominal pain all over with constipation. History of appendectomy.  EXAM: CT ABDOMEN AND PELVIS WITH CONTRAST  TECHNIQUE: Multidetector CT imaging of the abdomen and pelvis was performed using the standard protocol following bolus administration of intravenous contrast.  CONTRAST:  50mL OMNIPAQUE IOHEXOL 300 MG/ML SOLN, 100mL OMNIPAQUE IOHEXOL 300 MG/ML SOLN  COMPARISON:  02/15/2014  FINDINGS: BODY WALL: Unremarkable.  LOWER CHEST: Unremarkable.  ABDOMEN/PELVIS:  Liver: No focal abnormality.  Biliary: No evidence of biliary obstruction or stone.  Pancreas: Unremarkable.  Spleen: Unremarkable.  Adrenals: Unremarkable.  Kidneys and ureters: No hydronephrosis or stone.  Bladder: Prominent thickness of the bladder wall but no associated that inflammation. This appearance could be related to underdistention ; a urinalysis has been ordered.  Reproductive: Unremarkable.  Bowel: No obstruction. Appendectomy.  Retroperitoneum: No mass or adenopathy.  Peritoneum: Along the left pelvic sidewall there is an irregularly-shaped low-density collection measuring 2.6 cm, without peripheral enhancement or change from 02/15/2014. Ascites has been noted previously, this is favored to represent a small peritoneal inclusion cyst. Pathology from appendectomy was negative for mucocele.  Vascular: No acute abnormality.  OSSEOUS: L5 pars defects with low grade 1 anterolisthesis.  IMPRESSION: 1. No findings to explain acute abdominal pain. 2. Incidental/chronic findings are stable from 02/15/2014 and noted above.   Electronically Signed   By: Tiburcio PeaJonathan  Watts M.D.   On: 06/14/2014 07:23     EKG Interpretation None      MDM   Final diagnoses:  Pain  Abdominal pain in male    Epigastric pain,   tx with hydrocodone, phenergan and zantac    Benny LennertJoseph L Amandeep Nesmith,  MD 06/14/14 [phone]

## 2014-09-03 NOTE — Consult Note (Signed)
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   Patient was seen by consultant.   Consult note dictated.   Comments: Psychiatry: Patient seen and chart reviewed. Full note done. Patient denies any suicidal intent and has been consistant in this. Denies any current significant mood problems or psychosis. Currently not commitable and does not see himself as having a problem. Psychoeducation done. No further treatment indicated.  Electronic Signatures: Clapacs, Jackquline DenmarkJohn T (MD)  (Signed 10-Dec-15 10:24)  Authored: Brief Consult Note   Last Updated: 10-Dec-15 10:24 by Audery Amellapacs, John T (MD)

## 2014-09-03 NOTE — H&P (Signed)
PATIENT NAME:  Mitchell [name], Mitchell [name] MR#:  [id number] DATE OF BIRTH:  10/23/1985  DATE OF ADMISSION:  04/19/2014  REFERRING PHYSICIAN: Acquanetta Bellingebecca L Lord, MD   PRIMARY CARE PHYSICIAN: Nonlocal.   CHIEF COMPLAINT: Dizziness.  HISTORY OF PRESENT ILLNESS: A 29 year old African American gentleman with history of bipolar disorder not otherwise specified, presenting with dizziness. He was actually [facility] after taking 20 pills of Tegretol along with alcohol ingestion. States that he simply wanted to sleep, this was not a suicide attempt. He was observed in the hospital. He remained asymptomatic and was subsequently discharged. While at home, the patient became nauseous, lightheaded as well as dizzy, described difficulty walking. He represented to hospital for further workup and evaluation. The patient is quite belligerent and agitated at this time and refused to cooperate with history or physical. History is aided by his nonsignificant other who is at bedside. Noted to have an elevated Tegretol level. Case discussed with Poison Control.  REVIEW OF SYSTEMS: CONSTITUTIONAL: No fever, chills, fatigue, weakness.  EYES: No blurred vision, double vision, eye pain.  EARS, NOSE, AND THROAT: Denies tinnitus, ear pain, hearing loss. RESPIRATORY: Denies cough or shortness of breath. CARDIOVASCULAR: No chest pain, palpitation, edema. GASTROINTESTINAL: Positive for nausea, 1 episode of emesis. No diarrhea, abdominal pain. GENITOURINARY: No dysuria or hematuria.  ENDOCRINE: No nocturia or thyroid problems. HEMATOLOGIC AND LYMPHATICS: Denies easy bruising, bleeding. SKIN: No rashes, lesions. MUSCULOSKELETAL: No pain in neck, back, shoulder, knees, hips or arthritic symptoms.  NEUROLOGIC: Denies any paralysis or paresthesias.  PSYCHIATRIC: Denies anxiety or depressive symptoms. Denies any homicidal or suicidal ideation.   PAST MEDICAL HISTORY: Bipolar  disorder not otherwise specified, as well as ADHD.   SOCIAL HISTORY: Positive for alcohol, tobacco usage.   FAMILY HISTORY: No known cardiovascular disorders.   ALLERGIES: No known drug allergies.   HOME MEDICATIONS: Include Tegretol, however unknown dosage which he takes daily.   PHYSICAL EXAMINATION:  VITAL SIGNS: Temperature 98.1, heart rate 98, respirations 16, blood pressure 140/89, saturating 98% on room air. Weight 84.8 kg, BMI 23.7.  GENERAL: Well-nourished, well-developed, African American gentleman currently in minimal distress.  HEAD: Normocephalic, atraumatic.  EYES: Pupils equal, reactive to light. Extraocular muscles intact. No scleral icterus.  MOUTH: Moist mucosal membranes. Dentition intact. No abscess noted.  EARS, NOSE, THROAT: Clear without exudates. No external lesions. NECK: Supple. No thyromegaly or nodules. No JVD.  PULMONARY: Clear to auscultation bilaterally without wheezes, rhonchi. No use of accessory muscles. Good respiratory effort. CHEST: Tender to palpation. CARDIOVASCULAR: S1, S2, regular rate and rhythm. No murmurs, rubs, gallops. No edema. Pedal pulses 2+ bilaterally. GASTROINTESTINAL: Soft, nontender, nondistended. No masses. Positive bowel sounds. No hepatosplenomegaly. MUSCULOSKELETAL: No swelling, clubbing or edema. Range of motion full in all extremities.  NEUROLOGIC: Unable to fully assess given the patient's lack of cooperation and essential refusal for further physical examination; however, he has gross ataxia and uncoordination when attempting to eat.  SKIN: No ulceration, lesions, rashes or cyanosis. Skin warm, dry. Turgor intact.  PSYCHIATRIC: Mood and affect: He is agitated. He is awake, alert, oriented x 3. Insight and judgment intact.   LABORATORY DATA: Sodium 142, potassium 3.5, chloride 103, bicarb 32, BUN 6, creatinine 1.06, glucose 101. Tegretol 26.6, repeat level 27.8. WBC 7.4, hemoglobin 13.9, platelets of 191,000.   ASSESSMENT AND  PLAN: A 29 year old [place] American gentleman with history of bipolar disorder not otherwise specified presenting with dizziness.  1.  Tegretol toxicity which was  likely due to homicidal or suicidal ideation. Discussed with Poison Control via Emergency Department staff. He was given activatedcharcoal. He had EKG QRS within normal limits. We will place on telemetry, follow his levels as well as laboratories and will need 2 downward trending Tegretol levels prior to discharge. If any seizure activity, choose Ativan. If QRS recheck greater than 110, we will give him bicarbonate, hold further Tegretol for now.  2.  Venous thromboembolism prophylaxis with heparin subcutaneous.  CODE STATUS: The patient a full code.  TIME SPENT: Thirty-five minutes.   ____________________________ Cletis Athens. Elfida Shimada, MD dkh:TT [name]: 04/19/2014 22:33:41 ET T: 04/19/2014 22:55:50 ET JOB#: [id number]  cc: Cletis Athens. Cannon Quinton, MD, <Dictator> Jennilee Demarco Synetta Shadow MD ELECTRONICALLY SIGNED 04/20/2014 20:46

## 2014-09-03 NOTE — Discharge Summary (Signed)
PATIENT NAME:  Mitchell [name], Mitchell [name] MR#:  [id number] DATE OF BIRTH:  [birth year]  DATE OF ADMISSION:  04/19/2014 DATE OF DISCHARGE:  04/21/2014  ADMISSION DIAGNOSIS: Tegretol toxicity.   DISCHARGE DIAGNOSES: 1. Tegretol toxicity.  2. History of bipolar affective disorder.   CONSULTATIONS: Psychiatry.   LABORATORY DATA AT DISCHARGE: Tegretol level 17.5.   PHYSICAL EXAMINATION AT DISCHARGE: VITAL SIGNS: The patient is afebrile. Temperature 98.3, pulse 93, respirations 18, blood pressure 138/89, 99% on room air.  GENERAL: The patient is alert and oriented, not in acute distress. CARDIOVASCULAR: Regular rate and rhythm. No murmurs, gallops, or rubs. PMI was nondisplaced. LUNGS: Clear to auscultation without crackles, rales, rhonchi, or wheezing. Normal to percussion.  ABDOMEN: Bowel sounds positive. Nontender, nondistended. No hepatosplenomegaly.  EXTREMITIES: No clubbing, cyanosis, or edema.  NEUROLOGICAL: Cranial nerves II-XII are intact, no focal deficits.   HOSPITAL COURSE: A 29 year old male who presents after Tegretol toxicity, was found to have an elevation in his Tegretol level on laboratories, admitted for Tegretol toxicity.  For further details, please refer to H and P.   ASSESSMENT AND PLAN: 1. Tegretol toxicity.  The patient was admitted for elevated Tegretol level. He presented initially with dizziness. He was not suicidal or homicidal. I did have psychiatry clear him prior to discharge. He said that he took this extra Tegretol to get "high." Poison control was called by the ER physician. They recommended continue to monitor his Tegretol levels and if 2 of them were decreasing and he was asymptomatic, the patient could be discharged. The patient is asymptomatic. Tegretol levels are drastically declining. We are holding Tegretol until he sees a psychiatrist.  2. History of bipolar affective disorder. The patient will find follow up with his, psychiatrist.  Hold Tegretol for now.    DISCHARGE DIET: Regular diet.   DISCHARGE ACTIVITY: As tolerated.   DISCHARGE FOLLOWUP: The patient will follow up with his psychiatrist.   TIME SPENT: Approximately 45 minutes.   The patient was stable for discharge.    ____________________________ Kadin Canipe P. Juliene PinaMody, MD spm:mw [name]: 04/21/2014 13:15:34 ET T: 04/21/2014 14:00:07 ET JOB#: [id number]  cc: Mallie Giambra P. Juliene PinaMody, MD, <Dictator> Janyth ContesSITAL P Mishika Flippen MD ELECTRONICALLY SIGNED 04/22/2014 13:25

## 2015-07-25 ENCOUNTER — Emergency Department (HOSPITAL_COMMUNITY)
Admission: EM | Admit: 2015-07-25 | Discharge: 2015-07-25 | Disposition: A | Discharge status: Home / Self Care | Payer: Self-pay | Attending: Emergency Medicine | Admitting: Emergency Medicine

## 2015-07-25 ENCOUNTER — Encounter (HOSPITAL_COMMUNITY): Payer: Self-pay

## 2015-07-25 DIAGNOSIS — F17210 Nicotine dependence, cigarettes, uncomplicated: Secondary | ICD-10-CM | POA: Insufficient documentation

## 2015-07-25 DIAGNOSIS — J020 Streptococcal pharyngitis: Secondary | ICD-10-CM | POA: Insufficient documentation

## 2015-07-25 DIAGNOSIS — Z8719 Personal history of other diseases of the digestive system: Secondary | ICD-10-CM | POA: Insufficient documentation

## 2015-07-25 DIAGNOSIS — Z79899 Other long term (current) drug therapy: Secondary | ICD-10-CM | POA: Insufficient documentation

## 2015-07-25 LAB — RAPID STREP SCREEN (MED CTR MEBANE ONLY): Streptococcus, Group A Screen (Direct): POSITIVE — AB

## 2015-07-25 MED ORDER — PENICILLIN G BENZATHINE 1200000 UNIT/2ML IM SUSP
1.2000 10*6.[IU] | Freq: Once | INTRAMUSCULAR | Status: AC
  Administered 2015-07-25: 1.2 10*6.[IU] via INTRAMUSCULAR
  Filled 2015-07-25: qty 2

## 2015-07-25 NOTE — ED Notes (Signed)
patient here with sore throat x 1 day, no distress

## 2015-07-25 NOTE — ED Provider Notes (Signed)
History  By signing my name below, I, Mitchell [name], attest that this documentation has been prepared under the direction and in the presence of Mitchell FinancialBen Arbell Wycoff, PA-C. Electronically Signed: Karle PlumberJennifer [name], ED Scribe. 07/25/2015. 9:35 AM.  Chief Complaint  Patient presents with  . Sore Throat   The history is provided by the patient and medical records. No language interpreter was used.    HPI Comments:  Mitchell [name] is a [age] y.o. male who presents to the Emergency Department complaining of sore throat that began yesterday upon waking. He reports intermittent productive cough of phlegm. He states he has been taking an OTC cough drop medication but cannot remember what it is called. He denies modifying factors. He denies fever, chills, nausea, vomiting, SOB.  Past Medical History  Diagnosis Date  . Appendicitis    Past Surgical History  Procedure Laterality Date  . Laparoscopic appendectomy N/A 08/13/2013    Procedure: APPENDECTOMY LAPAROSCOPIC;  Surgeon: Liz MaladyBurke E Thompson, MD;  [facility];  Service: General;  Laterality: N/A;  . Laparotomy N/A 08/16/2013    Procedure: INCARCERATED ABDOMINAL WALL HERNIA REPAIR;  Surgeon: Cherylynn RidgesJames O Wyatt, MD;  [facility];  Service: General;  Laterality: N/A;   No family history on file. Social History  Substance Use Topics  . Smoking status: Current Every Day Smoker -- 1.00 packs/day    Types: Cigarettes  . Smokeless tobacco: None  . Alcohol Use: Yes    Review of Systems  A complete 10 system review of systems was obtained and all systems are negative except as noted in the HPI and PMH.   Allergies  Review of patient's allergies indicates no known allergies.  Home Medications   Prior to Admission medications   Medication Sig Start Date End Date Taking? Authorizing Provider  HYDROcodone-acetaminophen (NORCO/VICODIN) 5-325 MG per tablet Take 1 tablet by mouth every 6 (six) hours as needed for moderate pain. 06/14/14   Bethann BerkshireJoseph Zammit, MD   promethazine (PHENERGAN) 25 MG tablet Take 1 tablet (25 mg total) by mouth every 6 (six) hours as needed for nausea or vomiting. 06/14/14   Bethann BerkshireJoseph Zammit, MD  ranitidine (ZANTAC) 150 MG capsule Take 1 capsule (150 mg total) by mouth daily. 06/14/14   Bethann BerkshireJoseph Zammit, MD   Triage Vitals: BP 140/99 mmHg  Pulse 85  Temp(Src) 98.9 F (37.2 C) (Oral)  Resp 18  Ht 5\' 10"  (1.778 m)  Wt 193 lb 1.6 oz (87.59 kg)  BMI 27.71 kg/m2  SpO2 98% Physical Exam  Constitutional: He is oriented to person, place, and time. He appears well-developed and well-nourished.  HENT:  Head: Normocephalic and atraumatic.  Mouth/Throat: Uvula is midline and mucous membranes are normal. No trismus in the jaw. Oropharyngeal exudate present.  Eyes: EOM are normal.  Bi;lateral tonsillar swelling with exudate. No trismus.  Neck: Normal range of motion.  Cardiovascular: Normal rate, regular rhythm and normal heart sounds.  Exam reveals no gallop and no friction rub.   No murmur heard. Pulmonary/Chest: Effort normal and breath sounds normal. No respiratory distress. He has no wheezes. He has no rales.  Abdominal: Soft. There is no tenderness.  Musculoskeletal: Normal range of motion.  Lymphadenopathy:    He has cervical adenopathy.  Neurological: He is alert and oriented to person, place, and time.  Skin: Skin is warm and dry.  Psychiatric: He has a normal mood and affect. His behavior is normal.  Nursing note and vitals reviewed.   ED Course  Procedures (including critical care  time) DIAGNOSTIC STUDIES: Oxygen Saturation is 98% on RA, normal by my interpretation.   COORDINATION OF CARE: 9:35 AM- Will order injection of PCN and provide work note. Pt verbalizes understanding and agrees to plan.  Medications  penicillin g benzathine (BICILLIN LA) 1200000 UNIT/2ML injection 1.2 Million Units (1.2 Million Units Intramuscular Given 07/25/15 0920)   Labs Review Labs Reviewed  RAPID STREP SCREEN (NOT AT [facility]) - Abnormal;  Notable for the following:    Streptococcus, Group A Screen (Direct) POSITIVE (*)    All other components within normal limits    Imaging Review No results found. I have personally reviewed and evaluated these images and lab results as part of my medical decision-making.   EKG Interpretation None     Meds given in ED:  Medications  penicillin g benzathine (BICILLIN LA) 1200000 UNIT/2ML injection 1.2 Million Units (1.2 Million Units Intramuscular Given 07/25/15 0920)    Discharge Medication List as of 07/25/2015  9:55 AM     Filed Vitals:   07/25/15 0815  BP: 140/99  Pulse: 85  Temp: 98.9 F (37.2 C)  TempSrc: Oral  Resp: 18  Height:  (1.778 m)  Weight: 87.59 kg  SpO2: 98%    MDM  Pt afebrile with tonsillar exudate, cervical lymphadenopathy, & dysphagia; Positive rapid strep, diagnosis of strep. Treated in ED with PCN IM.  Discussed importance of water rehydration. Presentation non concerning for PTA or infxn spread to soft tissue. No trismus or uvula deviation. Specific return precautions discussed. Pt able to drink water in ED without difficulty with intact air way. NSAIDs for discomfort. Recommended PCP follow up.   Final diagnoses:  Strep pharyngitis    I personally performed the services described in this documentation, which was scribed in my presence. The recorded information has been reviewed and is accurate.    Mitchell Peek, PA-C 07/25/15 1014  Mitchell Monday, MD 07/26/15 1146

## 2015-07-25 NOTE — Discharge Instructions (Signed)
You were diagnosed with strep throat. He was treated for this with antibiotics in the emergency department. Take Motrin for your discomfort. Follow-up with your doctor as needed. Return to ED for new or worsened symptoms.  Strep Throat Strep throat is a bacterial infection of the throat. Your health care provider may call the infection tonsillitis or pharyngitis, depending on whether there is swelling in the tonsils or at the back of the throat. Strep throat is most common during the cold months of the year in children who are 11-33 years of age, but it can happen during any season in people of any age. This infection is spread from person to person (contagious) through coughing, sneezing, or close contact. CAUSES Strep throat is caused by the bacteria called Streptococcus pyogenes. RISK FACTORS This condition is more likely to develop in:  People who spend time in crowded places where the infection can spread easily.  People who have close contact with someone who has strep throat. SYMPTOMS Symptoms of this condition include:  Fever or chills.   Redness, swelling, or pain in the tonsils or throat.  Pain or difficulty when swallowing.  White or yellow spots on the tonsils or throat.  Swollen, tender glands in the neck or under the jaw.  Red rash all over the body (rare). DIAGNOSIS This condition is diagnosed by performing a rapid strep test or by taking a swab of your throat (throat culture test). Results from a rapid strep test are usually ready in a few minutes, but throat culture test results are available after one or two days. TREATMENT This condition is treated with antibiotic medicine. HOME CARE INSTRUCTIONS Medicines  Take over-the-counter and prescription medicines only as told by your health care provider.  Take your antibiotic as told by your health care provider. Do not stop taking the antibiotic even if you start to feel better.  Have family members who also have a  sore throat or fever tested for strep throat. They may need antibiotics if they have the strep infection. Eating and Drinking  Do not share food, drinking cups, or personal items that could cause the infection to spread to other people.  If swallowing is difficult, try eating soft foods until your sore throat feels better.  Drink enough fluid to keep your urine clear or pale yellow. General Instructions  Gargle with a salt-water mixture 3-4 times per day or as needed. To make a salt-water mixture, completely dissolve -1 tsp of salt in 1 cup of warm water.  Make sure that all household members wash their hands well.  Get plenty of rest.  Stay home from school or work until you have been taking antibiotics for 24 hours.  Keep all follow-up visits as told by your health care provider. This is important. SEEK MEDICAL CARE IF:  The glands in your neck continue to get bigger.  You develop a rash, cough, or earache.  You cough up a thick liquid that is green, yellow-brown, or bloody.  You have pain or discomfort that does not get better with medicine.  Your problems seem to be getting worse rather than better.  You have a fever. SEEK IMMEDIATE MEDICAL CARE IF:  You have new symptoms, such as vomiting, severe headache, stiff or painful neck, chest pain, or shortness of breath.  You have severe throat pain, drooling, or changes in your voice.  You have swelling of the neck, or the skin on the neck becomes red and tender.  You have signs  of dehydration, such as fatigue, dry mouth, and decreased urination.  You become increasingly sleepy, or you cannot wake up completely.  Your joints become red or painful.   This information is not intended to replace advice given to you by your health care provider. Make sure you discuss any questions you have with your health care provider.   Document Released: 04/26/2000 Document Revised: 01/18/2015 Document Reviewed: 08/22/2014 Elsevier  Interactive Patient Education [employer]2016 Elsevier Inc.

## 2015-10-28 ENCOUNTER — Other Ambulatory Visit: Payer: Self-pay

## 2015-10-28 ENCOUNTER — Emergency Department (HOSPITAL_COMMUNITY)
Admission: EM | Admit: 2015-10-28 | Discharge: 2015-10-28 | Disposition: A | Discharge status: Home / Self Care | Payer: Self-pay | Attending: Emergency Medicine | Admitting: Emergency Medicine

## 2015-10-28 ENCOUNTER — Encounter (HOSPITAL_COMMUNITY): Payer: Self-pay | Admitting: Emergency Medicine

## 2015-10-28 DIAGNOSIS — IMO0002 Reserved for concepts with insufficient information to code with codable children: Secondary | ICD-10-CM

## 2015-10-28 DIAGNOSIS — F10929 Alcohol use, unspecified with intoxication, unspecified: Secondary | ICD-10-CM | POA: Insufficient documentation

## 2015-10-28 DIAGNOSIS — R4781 Slurred speech: Secondary | ICD-10-CM | POA: Insufficient documentation

## 2015-10-28 LAB — COMPREHENSIVE METABOLIC PANEL
ALBUMIN: 4.2 g/dL (ref 3.5–5.0)
ALK PHOS: 65 U/L (ref 38–126)
ALT: 17 U/L (ref 17–63)
AST: 30 U/L (ref 15–41)
Anion gap: 10 (ref 5–15)
BILIRUBIN TOTAL: 0.7 mg/dL (ref 0.3–1.2)
BUN: 8 mg/dL (ref 6–20)
CALCIUM: 8.9 mg/dL (ref 8.9–10.3)
CO2: 24 mmol/L (ref 22–32)
Chloride: 106 mmol/L (ref 101–111)
Creatinine, Ser: 0.96 mg/dL (ref 0.61–1.24)
GFR calc Af Amer: >60 mL/min (ref >60)
GFR calc non Af Amer: >60 mL/min (ref >60)
GLUCOSE: 66 mg/dL (ref 65–99)
Potassium: 3.2 mmol/L — ABNORMAL LOW (ref 3.5–5.1)
Sodium: 140 mmol/L (ref 135–145)
TOTAL PROTEIN: 7 g/dL (ref 6.5–8.1)

## 2015-10-28 LAB — URINALYSIS, ROUTINE W REFLEX MICROSCOPIC
Bilirubin Urine: NEGATIVE
GLUCOSE, UA: NEGATIVE mg/dL
Hgb urine dipstick: NEGATIVE
Ketones, ur: 15 mg/dL — AB
LEUKOCYTES UA: NEGATIVE
NITRITE: NEGATIVE
PH: 5 (ref 5.0–8.0)
Protein, ur: NEGATIVE mg/dL
SPECIFIC GRAVITY, URINE: 1.016 (ref 1.005–1.030)

## 2015-10-28 LAB — RAPID URINE DRUG SCREEN, HOSP PERFORMED
Amphetamines: NOT DETECTED
BARBITURATES: NOT DETECTED
BENZODIAZEPINES: NOT DETECTED
Cocaine: NOT DETECTED
Opiates: NOT DETECTED
Tetrahydrocannabinol: POSITIVE — AB

## 2015-10-28 LAB — CBC WITH DIFFERENTIAL/PLATELET
BASOS ABS: 0 10*3/uL (ref 0.0–0.1)
Basophils Relative: 0 %
EOS PCT: 2 %
Eosinophils Absolute: 0.2 10*3/uL (ref 0.0–0.7)
HCT: 42.1 % (ref 39.0–52.0)
Hemoglobin: 14.3 g/dL (ref 13.0–17.0)
LYMPHS PCT: 29 %
Lymphs Abs: 2.1 10*3/uL (ref 0.7–4.0)
MCH: 29.7 pg (ref 26.0–34.0)
MCHC: 34 g/dL (ref 30.0–36.0)
MCV: 87.3 fL (ref 78.0–100.0)
MONO ABS: 0.5 10*3/uL (ref 0.1–1.0)
Monocytes Relative: 7 %
Neutro Abs: 4.4 10*3/uL (ref 1.7–7.7)
Neutrophils Relative %: 62 %
Platelets: 164 10*3/uL (ref 150–400)
RBC: 4.82 MIL/uL (ref 4.22–5.81)
RDW: 12.4 % (ref 11.5–15.5)
WBC: 7.2 10*3/uL (ref 4.0–10.5)

## 2015-10-28 LAB — ACETAMINOPHEN LEVEL: Acetaminophen (Tylenol), Serum: <10 ug/mL — ABNORMAL LOW (ref 10–30)

## 2015-10-28 LAB — SALICYLATE LEVEL

## 2015-10-28 LAB — ETHANOL: ALCOHOL ETHYL (B): 220 mg/dL — AB (ref <5)

## 2015-10-28 MED ORDER — SODIUM CHLORIDE 0.9 % IV BOLUS (SEPSIS)
1000.0000 mL | Freq: Once | INTRAVENOUS | Status: AC
  Administered 2015-10-28: 1000 mL via INTRAVENOUS

## 2015-10-28 MED ORDER — IBUPROFEN 400 MG PO TABS
400.0000 mg | ORAL_TABLET | Freq: Once | ORAL | Status: AC
  Administered 2015-10-28: 400 mg via ORAL
  Filled 2015-10-28: qty 1

## 2015-10-28 MED ORDER — ONDANSETRON HCL 4 MG/2ML IJ SOLN
4.0000 mg | Freq: Once | INTRAMUSCULAR | Status: AC
  Administered 2015-10-28: 4 mg via INTRAVENOUS

## 2015-10-28 NOTE — ED Notes (Signed)
Pt eating turkey sandwich

## 2015-10-28 NOTE — ED Notes (Signed)
Pt and family anxious to leave  n pa has to see and reevaluate him  That information given to the pt and his family  The pt is c/o a headache

## 2015-10-28 NOTE — ED Provider Notes (Addendum)
ED ECG REPORT   Date: 10/28/2015  Rate: 67  Rhythm: normal sinus rhythm  QRS Axis: normal  Intervals: normal  ST/T Wave abnormalities: early repolarization  Conduction Disutrbances:none  Narrative Interpretation:   Old EKG Reviewed: none available  I have personally reviewed the EKG tracing and agree with the computerized printout as noted.  Not crossing over from MUSE  Medical screening examination/treatment/procedure(s) were conducted as a shared visit with non-physician practitioner(s) and myself.  I personally evaluated the patient during the encounter.   EKG Interpretation None        EKG Interpretation None        Vanetta MuldersScott Berthel Bagnall, MD 10/28/15 1130  Vanetta MuldersScott Xyler Terpening, MD 10/28/15 1133

## 2015-10-28 NOTE — ED Notes (Signed)
Pt was told he needed to try and give urine sample, pt said he was unable to. Wants to "eat and go home"
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
## 2015-10-30 ENCOUNTER — Encounter (HOSPITAL_COMMUNITY): Payer: Self-pay

## 2016-02-19 ENCOUNTER — Emergency Department (HOSPITAL_COMMUNITY)
Admission: EM | Admit: 2016-02-19 | Discharge: 2016-02-19 | Disposition: A | Discharge status: Home / Self Care | Payer: Self-pay | Attending: Emergency Medicine | Admitting: Emergency Medicine

## 2016-02-19 ENCOUNTER — Emergency Department (HOSPITAL_COMMUNITY): Payer: Self-pay

## 2016-02-19 ENCOUNTER — Encounter (HOSPITAL_COMMUNITY): Payer: Self-pay | Admitting: Oncology

## 2016-02-19 DIAGNOSIS — R1013 Epigastric pain: Secondary | ICD-10-CM | POA: Insufficient documentation

## 2016-02-19 DIAGNOSIS — Z79899 Other long term (current) drug therapy: Secondary | ICD-10-CM | POA: Insufficient documentation

## 2016-02-19 LAB — URINALYSIS, ROUTINE W REFLEX MICROSCOPIC
BILIRUBIN URINE: NEGATIVE
GLUCOSE, UA: NEGATIVE mg/dL
HGB URINE DIPSTICK: NEGATIVE
KETONES UR: NEGATIVE mg/dL
Leukocytes, UA: NEGATIVE
Nitrite: NEGATIVE
PH: 6.5 (ref 5.0–8.0)
Protein, ur: NEGATIVE mg/dL
Specific Gravity, Urine: 1.017 (ref 1.005–1.030)

## 2016-02-19 LAB — COMPREHENSIVE METABOLIC PANEL
ALT: 16 U/L — ABNORMAL LOW (ref 17–63)
ANION GAP: 7 (ref 5–15)
AST: 27 U/L (ref 15–41)
Albumin: 4.2 g/dL (ref 3.5–5.0)
Alkaline Phosphatase: 76 U/L (ref 38–126)
BILIRUBIN TOTAL: 0.5 mg/dL (ref 0.3–1.2)
BUN: 18 mg/dL (ref 6–20)
CO2: 25 mmol/L (ref 22–32)
Calcium: 9.4 mg/dL (ref 8.9–10.3)
Chloride: 106 mmol/L (ref 101–111)
Creatinine, Ser: 0.97 mg/dL (ref 0.61–1.24)
GFR calc Af Amer: >60 mL/min (ref >60)
Glucose, Bld: 107 mg/dL — ABNORMAL HIGH (ref 65–99)
Potassium: 4.2 mmol/L (ref 3.5–5.1)
Sodium: 138 mmol/L (ref 135–145)
TOTAL PROTEIN: 7.5 g/dL (ref 6.5–8.1)

## 2016-02-19 LAB — CBC
HEMATOCRIT: 39.3 % (ref 39.0–52.0)
Hemoglobin: 14.1 g/dL (ref 13.0–17.0)
MCH: 31 pg (ref 26.0–34.0)
MCHC: 35.9 g/dL (ref 30.0–36.0)
MCV: 86.4 fL (ref 78.0–100.0)
Platelets: 175 10*3/uL (ref 150–400)
RBC: 4.55 MIL/uL (ref 4.22–5.81)
RDW: 12.1 % (ref 11.5–15.5)
WBC: 6.8 10*3/uL (ref 4.0–10.5)

## 2016-02-19 LAB — LIPASE, BLOOD: Lipase: 29 U/L (ref 11–51)

## 2016-02-19 LAB — ETHANOL: Alcohol, Ethyl (B): <5 mg/dL (ref <5)

## 2016-02-19 MED ORDER — HYDROCODONE-ACETAMINOPHEN 5-325 MG PO TABS
1.0000 | ORAL_TABLET | Freq: Once | ORAL | Status: AC
  Administered 2016-02-19: 1 via ORAL
  Filled 2016-02-19: qty 1

## 2016-02-19 MED ORDER — DICYCLOMINE HCL 10 MG PO CAPS
10.0000 mg | ORAL_CAPSULE | Freq: Once | ORAL | Status: AC
  Administered 2016-02-19: 10 mg via ORAL
  Filled 2016-02-19: qty 1

## 2016-02-19 MED ORDER — OMEPRAZOLE 20 MG PO CPDR: 20.0000 mg | DELAYED_RELEASE_CAPSULE | Freq: Every day | ORAL | 0 refills | Status: DC

## 2016-02-19 MED ORDER — SODIUM CHLORIDE 0.9 % IV BOLUS (SEPSIS)
1000.0000 mL | Freq: Once | INTRAVENOUS | Status: AC
Start: 2016-02-19 — End: 2016-02-19
  Administered 2016-02-19: 1000 mL via INTRAVENOUS

## 2016-02-19 MED ORDER — BACID PO TABS
2.0000 | ORAL_TABLET | Freq: Three times a day (TID) | ORAL | Status: DC
  Administered 2016-02-19: 2 via ORAL
  Filled 2016-02-19 (×2): qty 2

## 2016-02-19 MED ORDER — GI COCKTAIL ~~LOC~~
30.0000 mL | Freq: Once | ORAL | Status: AC
  Administered 2016-02-19: 30 mL via ORAL
  Filled 2016-02-19: qty 30

## 2016-02-19 NOTE — ED Provider Notes (Signed)
[facility]-EMERGENCY DEPT Provider Note   CSN: [id number] Arrival date & time: 02/19/16  1324     History   Chief Complaint Chief Complaint  Patient presents with  . Abdominal Pain    HPI Mitchell [name] is a 30 y.o. male.  This is a 30 year old who presents with 2 days of upper bilateral squeezing abdominal pain and diarrhea for the past week.  He states he has multiple bowel movements today.  Stool is soft, formed, he has noted no blood.  He states his appetite is unchanged.  Denies fever, dysuria      Past Medical History:  Diagnosis Date  . Appendicitis     Patient Active Problem List   Diagnosis Date Noted  . Hernia with obstruction 08/16/2013  . S/P laparoscopic appendectomy 08/13/2013    Past Surgical History:  Procedure Laterality Date  . HERNIA REPAIR    . LAPAROSCOPIC APPENDECTOMY N/A 08/13/2013   Procedure: APPENDECTOMY LAPAROSCOPIC;  Surgeon: Liz Malady, MD;  [facility];  Service: General;  Laterality: N/A;  . LAPAROTOMY N/A 08/16/2013   Procedure: INCARCERATED ABDOMINAL WALL HERNIA REPAIR;  Surgeon: Cherylynn Ridges, MD;  Location: [facility] OR;  Service: General;  Laterality: N/A;       Home Medications    Prior to Admission medications   Medication Sig Start Date End Date Taking? Authorizing Provider  HYDROcodone-acetaminophen (NORCO/VICODIN) 5-325 MG per tablet Take 1 tablet by mouth every 6 (six) hours as needed for moderate pain. Patient not taking: Reported on 02/19/2016 06/14/14   Bethann Berkshire, MD  omeprazole (PRILOSEC) 20 MG capsule Take 1 capsule (20 mg total) by mouth daily. Take 30 min before eating 02/19/16   Rise Mu, PA-C  promethazine (PHENERGAN) 25 MG tablet Take 1 tablet (25 mg total) by mouth every 6 (six) hours as needed for nausea or vomiting. Patient not taking: Reported on 02/19/2016 06/14/14   Bethann Berkshire, MD  ranitidine (ZANTAC) 150 MG capsule Take 1 capsule (150 mg total) by mouth daily. Patient not taking: Reported on  02/19/2016 06/14/14   Bethann Berkshire, MD    Family History No family history on file.  Social History Social History  Substance Use Topics  . Smoking status: Never Smoker  . Smokeless tobacco: Never Used  . Alcohol use Yes     Comment: socially     Allergies   Review of patient's allergies indicates no known allergies.   Review of Systems Review of Systems  Constitutional: Negative for fever.  Respiratory: Negative for shortness of breath.   Gastrointestinal: Positive for abdominal pain and diarrhea. Negative for nausea and vomiting.  Genitourinary: Negative for dysuria.  All other systems reviewed and are negative.    Physical Exam Updated Vital Signs BP 145/99 (BP Location: Left Arm)   Pulse 63   Temp 98.1 F (36.7 C) (Oral)   Resp 18   Ht 5\' 10"  (1.778 m)   Wt 81.6 kg   SpO2 100%   BMI 25.83 kg/m   Physical Exam  Constitutional: He appears well-developed and well-nourished.  Eyes: Pupils are equal, round, and reactive to light.  Neck: Normal range of motion.  Cardiovascular: Normal rate.   Pulmonary/Chest: Effort normal.  Abdominal: Soft. Bowel sounds are normal. He exhibits no distension. There is no tenderness.  Musculoskeletal: Normal range of motion.  Neurological: He is alert.  Skin: Skin is warm.  Psychiatric: He has a normal mood and affect.  Nursing note and vitals reviewed.    ED  Treatments / Results  Labs (all labs ordered are listed, but only abnormal results are displayed) Labs Reviewed  COMPREHENSIVE METABOLIC PANEL - Abnormal; Notable for the following:       Result Value   Glucose, Bld 107 (*)    ALT 16 (*)    All other components within normal limits  LIPASE, BLOOD  CBC  URINALYSIS, ROUTINE W REFLEX MICROSCOPIC (NOT AT [facility])  ETHANOL    EKG  EKG Interpretation None       Radiology Dg Abdomen 1 View  Result Date: 02/19/2016 CLINICAL DATA:  Worsening mid abdominal pain. EXAM: ABDOMEN - 1 VIEW COMPARISON:  CT abdomen  06/14/2014 FINDINGS: The bowel gas pattern is normal. No radio-opaque calculi or other significant radiographic abnormality are seen. IMPRESSION: Negative. Electronically Signed   By: Elige KoHetal  Patel   On: 02/19/2016 07:38    Procedures Procedures (including critical care time)  Medications Ordered in ED Medications  dicyclomine (BENTYL) capsule 10 mg (10 mg Oral Given 02/19/16 0510)  sodium chloride 0.9 % bolus 1,000 mL (0 mLs Intravenous Stopped 02/19/16 0805)  gi cocktail (Maalox,Lidocaine,Donnatal) (30 mLs Oral Given 02/19/16 0740)  HYDROcodone-acetaminophen (NORCO/VICODIN) 5-325 MG per tablet 1 tablet (1 tablet Oral Given 02/19/16 0806)     Initial Impression / Assessment and Plan / ED Course  I have reviewed the triage vital signs and the nursing notes.  Pertinent labs & imaging results that were available during my care of the patient were reviewed by me and considered in my medical decision making (see chart for details).  Clinical Course       Final Clinical Impressions(s) / ED Diagnoses   Final diagnoses:  Epigastric pain    New Prescriptions Discharge Medication List as of 02/19/2016  8:00 AM    START taking these medications   Details  omeprazole (PRILOSEC) 20 MG capsule Take 1 capsule (20 mg total) by mouth daily. Take 30 min before eating, Starting Mon 02/19/2016, Print         Earley FavorGail Andrew Soria, NP 02/19/16 0457    Earley FavorGail Zion Lint, NP 02/19/16 2006    Lorre NickAnthony Allen, MD 02/20/16 1120

## 2016-02-19 NOTE — ED Provider Notes (Signed)
Patient was received in care handout by Elsie StainGail Shultz NP. Patient presented to the ED today with 2 days of upper bilateral abdominal pain and diarrhea for the past week. Patient with history of chronic diarrhea and chronic abdominal pain. Patient was given Bentyl, probiotics and fluids in ED. On reassessment after fluids patient continued to have epigastric pain. He was given a GI cocktail. KUB showed no free air with normal bowel gas pattern. Patient states that he is still having epigastric pain after GI cocktail. Discussed with patient that this seems to be a chronic issue that he needs to follow up with GI. He has been seen multiple times in ED for same. Patient states that he never gets any relief with his abdominal pain after leaving the ED. Patient states this pain is similar to pain in the past. Informed patient that he is continue taking ibuprofen and Tylenol for pain. We'll give prescription for PPI. Patient given referral to GI and encouraged to follow up. Patient's lab and unremarkable. Patient without episode of diarrhea in ED. Vital signs and labs are unremarkable for dehydration. He is hemodynamically stable and was discharged home in no acute distress stable vital signs. Patient discussed with Dr. Freida BusmanAllen who agrees with the above plan. Patient verbalized understanding the plan of care. Strict return cautions given.    Rise MuKenneth T Kania Regnier, PA-C 02/19/16 1636    Lorre NickAnthony Allen, MD 02/20/16 [phone]

## 2016-02-19 NOTE — ED Triage Notes (Signed)
Per EMS pt c/o generalized abdominal pain.  Pt states he had chronic diarrhea.  Pt rates pain 8/10.  Pt ambulatory to room.

## 2016-02-19 NOTE — ED Notes (Addendum)
Patient informed that urine sample is needed. 

## 2016-02-19 NOTE — ED Notes (Signed)
Patient reminded that urine sample is needed.  

## 2016-02-19 NOTE — ED Notes (Signed)
Provider notified that patient continues to have pain

## 2016-02-19 NOTE — ED Notes (Signed)
Patient transported to X-ray 

## 2016-02-19 NOTE — Discharge Instructions (Signed)
Please take the Prilosec daily as prescribed. May take ibuprofen and Tylenol as needed for pain. Continue drinking plenty of fluids. Follow-up with gastroenterology. Please return to ED if symptoms worsen.

## 2016-02-19 NOTE — ED Notes (Signed)
Bed: [id number] Expected date:  Expected time:  Means of arrival:  Comments: 30yo M abd pain

## 2016-08-25 ENCOUNTER — Emergency Department (HOSPITAL_COMMUNITY)
Admission: EM | Admit: 2016-08-25 | Discharge: 2016-08-25 | Disposition: A | Discharge status: Home / Self Care | Payer: Self-pay | Attending: Emergency Medicine | Admitting: Emergency Medicine

## 2016-08-25 ENCOUNTER — Encounter (HOSPITAL_COMMUNITY): Payer: Self-pay | Admitting: Emergency Medicine

## 2016-08-25 ENCOUNTER — Emergency Department (HOSPITAL_COMMUNITY): Payer: Self-pay

## 2016-08-25 DIAGNOSIS — Y939 Activity, unspecified: Secondary | ICD-10-CM | POA: Insufficient documentation

## 2016-08-25 DIAGNOSIS — Z5321 Procedure and treatment not carried out due to patient leaving prior to being seen by health care provider: Secondary | ICD-10-CM | POA: Insufficient documentation

## 2016-08-25 DIAGNOSIS — W268XXA Contact with other sharp object(s), not elsewhere classified, initial encounter: Secondary | ICD-10-CM | POA: Insufficient documentation

## 2016-08-25 DIAGNOSIS — L03113 Cellulitis of right upper limb: Secondary | ICD-10-CM

## 2016-08-25 DIAGNOSIS — Y999 Unspecified external cause status: Secondary | ICD-10-CM | POA: Insufficient documentation

## 2016-08-25 DIAGNOSIS — S61212A Laceration without foreign body of right middle finger without damage to nail, initial encounter: Secondary | ICD-10-CM | POA: Insufficient documentation

## 2016-08-25 DIAGNOSIS — F17200 Nicotine dependence, unspecified, uncomplicated: Secondary | ICD-10-CM | POA: Insufficient documentation

## 2016-08-25 DIAGNOSIS — S61411A Laceration without foreign body of right hand, initial encounter: Secondary | ICD-10-CM | POA: Insufficient documentation

## 2016-08-25 DIAGNOSIS — Y929 Unspecified place or not applicable: Secondary | ICD-10-CM | POA: Insufficient documentation

## 2016-08-25 DIAGNOSIS — Z21 Asymptomatic human immunodeficiency virus [HIV] infection status: Secondary | ICD-10-CM | POA: Insufficient documentation

## 2016-08-25 DIAGNOSIS — Y9389 Activity, other specified: Secondary | ICD-10-CM | POA: Insufficient documentation

## 2016-08-25 LAB — CBC
HEMATOCRIT: 38.1 % — AB (ref 39.0–52.0)
HEMOGLOBIN: 13.3 g/dL (ref 13.0–17.0)
MCH: 30.7 pg (ref 26.0–34.0)
MCHC: 34.9 g/dL (ref 30.0–36.0)
MCV: 88 fL (ref 78.0–100.0)
Platelets: 168 10*3/uL (ref 150–400)
RBC: 4.33 MIL/uL (ref 4.22–5.81)
RDW: 12 % (ref 11.5–15.5)
WBC: 12 10*3/uL — AB (ref 4.0–10.5)

## 2016-08-25 MED ORDER — TETANUS-DIPHTH-ACELL PERTUSSIS 5-2.5-18.5 LF-MCG/0.5 IM SUSP
0.5000 mL | Freq: Once | INTRAMUSCULAR | Status: AC
  Administered 2016-08-25: 0.5 mL via INTRAMUSCULAR
  Filled 2016-08-25: qty 0.5

## 2016-08-25 MED ORDER — OXYCODONE-ACETAMINOPHEN 5-325 MG PO TABS
2.0000 | ORAL_TABLET | Freq: Once | ORAL | Status: AC
  Administered 2016-08-25: 2 via ORAL
  Filled 2016-08-25: qty 2

## 2016-08-25 MED ORDER — CLINDAMYCIN HCL 300 MG PO CAPS: 300.0000 mg | ORAL_CAPSULE | Freq: Three times a day (TID) | ORAL | 0 refills | Status: DC

## 2016-08-25 MED ORDER — MORPHINE SULFATE (PF) 4 MG/ML IV SOLN
4.0000 mg | Freq: Once | INTRAVENOUS | Status: AC
  Administered 2016-08-25: 4 mg via INTRAVENOUS
  Filled 2016-08-25: qty 1

## 2016-08-25 MED ORDER — CLINDAMYCIN PHOSPHATE 900 MG/50ML IV SOLN
900.0000 mg | Freq: Once | INTRAVENOUS | Status: AC
  Administered 2016-08-25: 900 mg via INTRAVENOUS
  Filled 2016-08-25: qty 50

## 2016-08-25 MED ORDER — LIDOCAINE HCL 2 % IJ SOLN
20.0000 mL | Freq: Once | INTRAMUSCULAR | Status: AC
  Administered 2016-08-25: 400 mg
  Filled 2016-08-25: qty 20

## 2016-08-25 MED ORDER — HYDROCODONE-ACETAMINOPHEN 5-325 MG PO TABS: 1.0000 | ORAL_TABLET | Freq: Four times a day (QID) | ORAL | 0 refills | Status: DC | PRN
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
## 2016-08-25 NOTE — ED Notes (Signed)
Called for Pt. No answer. Times 3.

## 2016-08-25 NOTE — ED Notes (Signed)
Pt not visualized in waiting room.   
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
## 2016-08-26 MED FILL — CLINDAMYCIN HCL 300 MG CAPS: 300 | 7 days supply | Qty: 21 | Fill #0

## 2016-08-26 MED FILL — HYDROCODON-APAP 5-325: 5-325 | 2 days supply | Qty: 10 | Fill #0

## 2016-08-27 ENCOUNTER — Emergency Department (HOSPITAL_COMMUNITY)
Admission: EM | Admit: 2016-08-27 | Discharge: 2016-08-27 | Disposition: A | Discharge status: Home / Self Care | Payer: Self-pay | Attending: Emergency Medicine | Admitting: Emergency Medicine

## 2016-08-27 ENCOUNTER — Encounter (HOSPITAL_COMMUNITY): Payer: Self-pay

## 2016-08-27 DIAGNOSIS — F17200 Nicotine dependence, unspecified, uncomplicated: Secondary | ICD-10-CM | POA: Insufficient documentation

## 2016-08-27 DIAGNOSIS — L089 Local infection of the skin and subcutaneous tissue, unspecified: Secondary | ICD-10-CM | POA: Insufficient documentation

## 2016-08-27 DIAGNOSIS — S61451D Open bite of right hand, subsequent encounter: Secondary | ICD-10-CM | POA: Insufficient documentation

## 2016-08-27 LAB — COMPREHENSIVE METABOLIC PANEL
ALK PHOS: 69 U/L (ref 38–126)
ALT: 18 U/L (ref 17–63)
ANION GAP: 11 (ref 5–15)
AST: 25 U/L (ref 15–41)
Albumin: 4 g/dL (ref 3.5–5.0)
BILIRUBIN TOTAL: 0.4 mg/dL (ref 0.3–1.2)
BUN: 13 mg/dL (ref 6–20)
CALCIUM: 9.4 mg/dL (ref 8.9–10.3)
CO2: 28 mmol/L (ref 22–32)
CREATININE: 1.16 mg/dL (ref 0.61–1.24)
Chloride: 99 mmol/L — ABNORMAL LOW (ref 101–111)
GFR calc non Af Amer: >60 mL/min (ref >60)
GLUCOSE: 124 mg/dL — AB (ref 65–99)
Potassium: 4.1 mmol/L (ref 3.5–5.1)
SODIUM: 138 mmol/L (ref 135–145)
TOTAL PROTEIN: 6.7 g/dL (ref 6.5–8.1)

## 2016-08-27 LAB — CBC WITH DIFFERENTIAL/PLATELET
BASOS ABS: 0 10*3/uL (ref 0.0–0.1)
BASOS PCT: 0 %
EOS ABS: 0.3 10*3/uL (ref 0.0–0.7)
Eosinophils Relative: 3 %
HCT: 36.6 % — ABNORMAL LOW (ref 39.0–52.0)
Hemoglobin: 12.9 g/dL — ABNORMAL LOW (ref 13.0–17.0)
Lymphocytes Relative: 21 %
Lymphs Abs: 2.3 10*3/uL (ref 0.7–4.0)
MCH: 31.1 pg (ref 26.0–34.0)
MCHC: 35.2 g/dL (ref 30.0–36.0)
MCV: 88.2 fL (ref 78.0–100.0)
MONO ABS: 0.8 10*3/uL (ref 0.1–1.0)
MONOS PCT: 8 %
NEUTROS PCT: 68 %
Neutro Abs: 7.8 10*3/uL — ABNORMAL HIGH (ref 1.7–7.7)
Platelets: 171 10*3/uL (ref 150–400)
RBC: 4.15 MIL/uL — ABNORMAL LOW (ref 4.22–5.81)
RDW: 12 % (ref 11.5–15.5)
WBC: 11.3 10*3/uL — ABNORMAL HIGH (ref 4.0–10.5)

## 2016-08-27 LAB — I-STAT CG4 LACTIC ACID, ED: Lactic Acid, Venous: 1.04 mmol/L (ref 0.5–1.9)

## 2016-08-27 MED ORDER — AMOXICILLIN-POT CLAVULANATE 875-125 MG PO TABS
1.0000 | ORAL_TABLET | Freq: Once | ORAL | Status: AC
  Administered 2016-08-27: 1 via ORAL
  Filled 2016-08-27: qty 1

## 2016-08-27 MED ORDER — SODIUM CHLORIDE 0.9 % IV BOLUS (SEPSIS)
500.0000 mL | Freq: Once | INTRAVENOUS | Status: AC
Start: 2016-08-27 — End: 2016-08-27
  Administered 2016-08-27: 500 mL via INTRAVENOUS

## 2016-08-27 MED ORDER — MORPHINE SULFATE (PF) 4 MG/ML IV SOLN
4.0000 mg | Freq: Once | INTRAVENOUS | Status: AC
  Administered 2016-08-27: 4 mg via INTRAVENOUS
  Filled 2016-08-27: qty 1

## 2016-08-27 MED ORDER — OXYCODONE-ACETAMINOPHEN 5-325 MG PO TABS: 2.0000 | ORAL_TABLET | ORAL | 0 refills | Status: DC | PRN

## 2016-08-27 MED ORDER — AMOXICILLIN-POT CLAVULANATE 875-125 MG PO TABS: 1.0000 | ORAL_TABLET | Freq: Two times a day (BID) | ORAL | 0 refills | Status: DC

## 2016-08-27 NOTE — Discharge Instructions (Signed)
You were seen in the ED today with an infection in the right hand. This will need urgent surgical evaluation. I spoke with Dr. Izora Ribas by phone who would like to see you in the office tomorrow at 8 AM. Make sure to not miss this appointment. Take the new antibiotic plus the old one. I have changed your pain medication as well. Do not take both pain medications. You should discard the Hydrocodone from earlier.   Return to the ED with any sudden worsening symptoms including worsening pain, fever, chills, or worsening redness.

## 2016-08-27 NOTE — Care Management (Signed)
ED CM was consulted by [facility] RN concerning  Medication assistance. Patient is without health insurance. Patient reports not being able to afford his discharge medication.  CM discussed MATCH program and guidelines patient is agreeable. Patient enrolled into the program, letter printed and given to patient with instructions on how to redeem. Patient verbalized understanding teach back done.
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
## 2016-08-27 NOTE — ED Triage Notes (Signed)
Per Pt, Pt is coming for a recheck of a laceration noted to his right hand. Pt has some edema and erythema noted around the laceration with some brown drainage noted per patient.

## 2016-08-27 NOTE — ED Notes (Signed)
Wound at right hand cleaned with sterile water and dressed with non stick pad and guaze.

## 2016-08-27 NOTE — ED Notes (Signed)
Pt states he understands instructions. Home stable with steady gait with girlfriend.

## 2016-08-28 ENCOUNTER — Ambulatory Visit (HOSPITAL_COMMUNITY)
Admission: RE | Admit: 2016-08-28 | Discharge: 2016-08-28 | Disposition: A | Discharge status: Home / Self Care | Payer: Self-pay | Source: Ambulatory Visit | Attending: General Surgery | Admitting: General Surgery

## 2016-08-28 ENCOUNTER — Encounter (HOSPITAL_COMMUNITY): Payer: Self-pay | Admitting: General Practice

## 2016-08-28 DIAGNOSIS — S61202A Unspecified open wound of right middle finger without damage to nail, initial encounter: Secondary | ICD-10-CM | POA: Insufficient documentation

## 2016-08-28 DIAGNOSIS — L089 Local infection of the skin and subcutaneous tissue, unspecified: Secondary | ICD-10-CM | POA: Insufficient documentation

## 2016-08-28 DIAGNOSIS — Z538 Procedure and treatment not carried out for other reasons: Secondary | ICD-10-CM | POA: Insufficient documentation

## 2016-08-28 DIAGNOSIS — F17200 Nicotine dependence, unspecified, uncomplicated: Secondary | ICD-10-CM | POA: Insufficient documentation

## 2016-08-28 DIAGNOSIS — S61401A Unspecified open wound of right hand, initial encounter: Secondary | ICD-10-CM | POA: Insufficient documentation

## 2016-08-28 HISTORY — DX: Unspecified glaucoma: H40.9

## 2016-08-28 MED ORDER — FENTANYL CITRATE (PF) 250 MCG/5ML IJ SOLN
INTRAMUSCULAR | Status: AC
  Filled 2016-08-28: qty 5

## 2016-08-28 MED ORDER — SUCCINYLCHOLINE CHLORIDE 200 MG/10ML IV SOSY
PREFILLED_SYRINGE | INTRAVENOUS | Status: AC
  Filled 2016-08-28: qty 10

## 2016-08-28 MED ORDER — LIDOCAINE 2% (20 MG/ML) 5 ML SYRINGE
INTRAMUSCULAR | Status: AC
  Filled 2016-08-28: qty 5

## 2016-08-28 MED ORDER — MIDAZOLAM HCL 2 MG/2ML IJ SOLN
INTRAMUSCULAR | Status: AC
  Filled 2016-08-28: qty 2

## 2016-08-28 MED ORDER — CEFAZOLIN (ANCEF) 1 G IV SOLR
2.0000 g | INTRAVENOUS | Status: DC
  Filled 2016-08-28: qty 2

## 2016-08-28 MED ORDER — LACTATED RINGERS IV SOLN
INTRAVENOUS | Status: DC
  Administered 2016-08-28: 16:00:00 via INTRAVENOUS

## 2016-08-28 NOTE — Progress Notes (Signed)
Pt seen 4/15 for R hand infection; returned with worsening symptoms. Will perform I&D in OR.

## 2016-08-28 NOTE — Progress Notes (Signed)
Pre-op instructions given to pt only; please complete pt assessments on DOS. Pt made aware to stop taking Aspirin, vitamins, fish oil and herbal medications. Do not take any NSAIDs ie: Ibuprofen, Advil, Naproxen, BC and Goody Powder or any medication containing Aspirin. Pt denies SOB, chest pain, and being under the care of a cardiologist. Pt verbalized understanding of all pre-op instructions.

## 2016-08-28 NOTE — Progress Notes (Signed)
Case cancelled for today due to OR and physician availability. Patient was given choice of being admitted overnight or to be discharged and return in the am. Patient stated he would rather go home and return. He also asked if we could change the dressing. Dr Izora Ribas informed and agreeable to the clean dressing. Old dressing removed and new dressing placed with telfa, 4x4's kling and coban. Patient instructed to return at 0530 on 08/29/2016 an to resume regular diet until midnight and after that remain NPO. Pt instructed that he may take pain meds in the am as needed.
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
## 2016-08-29 ENCOUNTER — Encounter (HOSPITAL_COMMUNITY)
Admission: RE | Disposition: A | Discharge status: Home / Self Care | Payer: Self-pay | Source: Ambulatory Visit | Attending: General Surgery

## 2016-08-29 ENCOUNTER — Ambulatory Visit (HOSPITAL_COMMUNITY): Payer: Self-pay | Admitting: Anesthesiology

## 2016-08-29 ENCOUNTER — Ambulatory Visit (HOSPITAL_COMMUNITY)
Admission: RE | Admit: 2016-08-29 | Discharge: 2016-08-29 | Disposition: A | Discharge status: Home / Self Care | Payer: Self-pay | Source: Ambulatory Visit | Attending: General Surgery | Admitting: General Surgery

## 2016-08-29 ENCOUNTER — Encounter (HOSPITAL_COMMUNITY): Payer: Self-pay | Admitting: Certified Registered Nurse Anesthetist

## 2016-08-29 DIAGNOSIS — S61401A Unspecified open wound of right hand, initial encounter: Secondary | ICD-10-CM | POA: Insufficient documentation

## 2016-08-29 DIAGNOSIS — F17200 Nicotine dependence, unspecified, uncomplicated: Secondary | ICD-10-CM | POA: Insufficient documentation

## 2016-08-29 DIAGNOSIS — L089 Local infection of the skin and subcutaneous tissue, unspecified: Secondary | ICD-10-CM | POA: Insufficient documentation

## 2016-08-29 DIAGNOSIS — S61202A Unspecified open wound of right middle finger without damage to nail, initial encounter: Secondary | ICD-10-CM | POA: Insufficient documentation

## 2016-08-29 HISTORY — PX: I & D EXTREMITY: SHX5045

## 2016-08-29 SURGERY — IRRIGATION AND DEBRIDEMENT EXTREMITY
Anesthesia: General | Site: Hand | Laterality: Right

## 2016-08-29 SURGERY — IRRIGATION AND DEBRIDEMENT WOUND
Anesthesia: General | Laterality: Right

## 2016-08-29 MED ORDER — FENTANYL CITRATE (PF) 100 MCG/2ML IJ SOLN
INTRAMUSCULAR | Status: DC | PRN
  Administered 2016-08-29 (×3): 25 ug via INTRAVENOUS
  Administered 2016-08-29: 50 ug via INTRAVENOUS

## 2016-08-29 MED ORDER — LACTATED RINGERS IV SOLN
INTRAVENOUS | Status: DC | PRN
  Administered 2016-08-29 (×2): via INTRAVENOUS

## 2016-08-29 MED ORDER — OXYMETAZOLINE HCL 0.05 % NA SOLN
NASAL | Status: AC
  Filled 2016-08-29: qty 15

## 2016-08-29 MED ORDER — FENTANYL CITRATE (PF) 250 MCG/5ML IJ SOLN
INTRAMUSCULAR | Status: AC
  Filled 2016-08-29: qty 5

## 2016-08-29 MED ORDER — ONDANSETRON HCL 4 MG/2ML IJ SOLN
INTRAMUSCULAR | Status: AC
  Filled 2016-08-29: qty 2

## 2016-08-29 MED ORDER — OXYCODONE-ACETAMINOPHEN 5-325 MG PO TABS
1.0000 | ORAL_TABLET | Freq: Once | ORAL | Status: AC
  Administered 2016-08-29: 1 via ORAL
  Filled 2016-08-29: qty 1

## 2016-08-29 MED ORDER — ARTIFICIAL TEARS OP OINT
TOPICAL_OINTMENT | OPHTHALMIC | Status: AC
  Filled 2016-08-29: qty 3.5

## 2016-08-29 MED ORDER — PROPOFOL 10 MG/ML IV BOLUS
INTRAVENOUS | Status: DC | PRN
  Administered 2016-08-29: 200 mg via INTRAVENOUS

## 2016-08-29 MED ORDER — LIDOCAINE 2% (20 MG/ML) 5 ML SYRINGE
INTRAMUSCULAR | Status: AC
  Filled 2016-08-29: qty 5

## 2016-08-29 MED ORDER — LIDOCAINE HCL (CARDIAC) 20 MG/ML IV SOLN
INTRAVENOUS | Status: DC | PRN
  Administered 2016-08-29: 80 mg via INTRATRACHEAL

## 2016-08-29 MED ORDER — HYDROMORPHONE HCL 1 MG/ML IJ SOLN
INTRAMUSCULAR | Status: AC
  Filled 2016-08-29: qty 1

## 2016-08-29 MED ORDER — 0.9 % SODIUM CHLORIDE (POUR BTL) OPTIME
TOPICAL | Status: DC | PRN
  Administered 2016-08-29: 1000 mL

## 2016-08-29 MED ORDER — OXYCODONE-ACETAMINOPHEN 5-325 MG PO TABS
ORAL_TABLET | ORAL | Status: AC
  Administered 2016-08-29: 1 via ORAL
  Filled 2016-08-29: qty 1

## 2016-08-29 MED ORDER — ONDANSETRON HCL 4 MG/2ML IJ SOLN
INTRAMUSCULAR | Status: DC | PRN
  Administered 2016-08-29: 4 mg via INTRAVENOUS

## 2016-08-29 MED ORDER — MIDAZOLAM HCL 5 MG/5ML IJ SOLN
INTRAMUSCULAR | Status: DC | PRN
  Administered 2016-08-29: 2 mg via INTRAVENOUS

## 2016-08-29 MED ORDER — MIDAZOLAM HCL 2 MG/2ML IJ SOLN
INTRAMUSCULAR | Status: AC
  Filled 2016-08-29: qty 2

## 2016-08-29 MED ORDER — BUPIVACAINE HCL (PF) 0.25 % IJ SOLN
INTRAMUSCULAR | Status: DC | PRN
  Administered 2016-08-29: 10 mL

## 2016-08-29 MED ORDER — CEFAZOLIN SODIUM 1 G IJ SOLR
INTRAMUSCULAR | Status: DC | PRN
  Administered 2016-08-29: 2 g via INTRAMUSCULAR

## 2016-08-29 MED ORDER — BUPIVACAINE HCL (PF) 0.25 % IJ SOLN
INTRAMUSCULAR | Status: AC
  Filled 2016-08-29: qty 30

## 2016-08-29 MED ORDER — SODIUM CHLORIDE 0.9 % IR SOLN
Status: DC | PRN
  Administered 2016-08-29: 3000 mL

## 2016-08-29 MED ORDER — HYDROMORPHONE HCL 1 MG/ML IJ SOLN
0.2500 mg | INTRAMUSCULAR | Status: DC | PRN
  Administered 2016-08-29 (×2): 0.5 mg via INTRAVENOUS

## 2016-08-29 MED ORDER — PROPOFOL 10 MG/ML IV BOLUS
INTRAVENOUS | Status: AC
  Filled 2016-08-29: qty 40

## 2016-08-29 SURGICAL SUPPLY — 40 items
BAG DECANTER FOR FLEXI CONT (MISCELLANEOUS) ×1 IMPLANT
BANDAGE ACE 3X5.8 VEL STRL LF (GAUZE/BANDAGES/DRESSINGS) ×2 IMPLANT
BANDAGE ACE 4X5 VEL STRL LF (GAUZE/BANDAGES/DRESSINGS) ×2 IMPLANT
BNDG GAUZE ELAST 4 BULKY (GAUZE/BANDAGES/DRESSINGS) ×3 IMPLANT
CORDS BIPOLAR (ELECTRODE) ×2 IMPLANT
CUFF TOURNIQUET SINGLE 18IN (TOURNIQUET CUFF) ×2 IMPLANT
DRAPE SURG 17X23 STRL (DRAPES) ×3 IMPLANT
ELECT REM PT RETURN 9FT ADLT (ELECTROSURGICAL) ×3
ELECTRODE REM PT RTRN 9FT ADLT (ELECTROSURGICAL) IMPLANT
GAUZE PACKING IODOFORM 1/4X5 (PACKING) IMPLANT
GAUZE SPONGE 4X4 12PLY STRL (GAUZE/BANDAGES/DRESSINGS) ×3 IMPLANT
GAUZE SPONGE 4X4 12PLY STRL LF (GAUZE/BANDAGES/DRESSINGS) ×2 IMPLANT
GAUZE XEROFORM 1X8 LF (GAUZE/BANDAGES/DRESSINGS) ×3 IMPLANT
GLOVE BIOGEL M 8.0 STRL (GLOVE) ×3 IMPLANT
GOWN STRL REUS W/ TWL LRG LVL3 (GOWN DISPOSABLE) ×2 IMPLANT
GOWN STRL REUS W/TWL LRG LVL3 (GOWN DISPOSABLE) ×6
HANDPIECE INTERPULSE COAX TIP (DISPOSABLE) ×3
KIT BASIN OR (CUSTOM PROCEDURE TRAY) ×3 IMPLANT
KIT ROOM TURNOVER OR (KITS) ×3 IMPLANT
MANIFOLD NEPTUNE II (INSTRUMENTS) ×3 IMPLANT
NEEDLE HYPO 25GX1X1/2 BEV (NEEDLE) ×3 IMPLANT
NS IRRIG 1000ML POUR BTL (IV SOLUTION) ×3 IMPLANT
PACK ORTHO EXTREMITY (CUSTOM PROCEDURE TRAY) ×3 IMPLANT
PAD ARMBOARD 7.5X6 YLW CONV (MISCELLANEOUS) ×6 IMPLANT
PAD CAST 3X4 CTTN HI CHSV (CAST SUPPLIES) ×1 IMPLANT
PAD CAST 4YDX4 CTTN HI CHSV (CAST SUPPLIES) IMPLANT
PADDING CAST COTTON 3X4 STRL (CAST SUPPLIES) ×3
PADDING CAST COTTON 4X4 STRL (CAST SUPPLIES) ×3
SET HNDPC FAN SPRY TIP SCT (DISPOSABLE) IMPLANT
SOAP 2 % CHG 4 OZ (WOUND CARE) ×3 IMPLANT
SPONGE LAP 18X18 X RAY DECT (DISPOSABLE) ×3 IMPLANT
SPONGE LAP 4X18 X RAY DECT (DISPOSABLE) ×3 IMPLANT
SWAB CULTURE ESWAB REG 1ML (MISCELLANEOUS) IMPLANT
SYR CONTROL 10ML LL (SYRINGE) ×2 IMPLANT
TOWEL OR 17X24 6PK STRL BLUE (TOWEL DISPOSABLE) ×3 IMPLANT
TOWEL OR 17X26 10 PK STRL BLUE (TOWEL DISPOSABLE) ×3 IMPLANT
TUBE CONNECTING 12'X1/4 (SUCTIONS) ×1
TUBE CONNECTING 12X1/4 (SUCTIONS) ×2 IMPLANT
WATER STERILE IRR 1000ML POUR (IV SOLUTION) ×3 IMPLANT
YANKAUER SUCT BULB TIP NO VENT (SUCTIONS) ×3 IMPLANT

## 2016-08-29 NOTE — Progress Notes (Signed)
Patient complains of 7/10 pain in right hand, requesting something for pain. Dr Okey Dupre notified, order for Percocet given. 1 tablet given po to patient.

## 2016-08-29 NOTE — Anesthesia Preprocedure Evaluation (Addendum)
Anesthesia Evaluation  Patient identified by MRN, date of birth, ID band Patient awake    Reviewed: Allergy & Precautions, H&P , NPO status , Patient's Chart, lab work & pertinent test results  Airway Mallampati: II  TM Distance: >3 FB Neck ROM: Full    Dental no notable dental hx. (+) Missing, Dental Advisory Given   Pulmonary Current Smoker,    Pulmonary exam normal breath sounds clear to auscultation       Cardiovascular negative cardio ROS   Rhythm:Regular Rate:Normal     Neuro/Psych negative neurological ROS  negative psych ROS   GI/Hepatic negative GI ROS, Neg liver ROS,   Endo/Other  negative endocrine ROS  Renal/GU negative Renal ROS  negative genitourinary   Musculoskeletal   Abdominal   Peds  Hematology negative hematology ROS (+)   Anesthesia Other Findings   Reproductive/Obstetrics negative OB ROS                            Anesthesia Physical Anesthesia Plan  ASA: II  Anesthesia Plan: General   Post-op Pain Management:    Induction: Intravenous  Airway Management Planned: LMA  Additional Equipment:   Intra-op Plan:   Post-operative Plan: Extubation in OR  Informed Consent: I have reviewed the patients History and Physical, chart, labs and discussed the procedure including the risks, benefits and alternatives for the proposed anesthesia with the patient or authorized representative who has indicated his/her understanding and acceptance.   Dental advisory given  Plan Discussed with: CRNA  Anesthesia Plan Comments:         Anesthesia Quick Evaluation

## 2016-08-29 NOTE — Anesthesia Procedure Notes (Signed)
Procedure Name: LMA Insertion Date/Time: 08/29/2016 7:39 AM Performed by: Faustino Congress Sukhdeep Wieting Pre-anesthesia Checklist: Patient identified, Emergency Drugs available, Suction available and Patient being monitored Patient Re-evaluated:Patient Re-evaluated prior to inductionOxygen Delivery Method: Circle System Utilized Preoxygenation: Pre-oxygenation with 100% oxygen Intubation Type: IV induction Ventilation: Mask ventilation without difficulty LMA: LMA inserted LMA Size: 5.0 Number of attempts: 1 Placement Confirmation: positive ETCO2 Tube secured with: Tape Dental Injury: Teeth and Oropharynx as per pre-operative assessment

## 2016-08-29 NOTE — Progress Notes (Signed)
Per Dr. Izora Ribas, patient received a prescription for pain medication when he saw him a couple days ago.  At discharge, patient states that he is out of pain pills.  Dr. Izora Ribas instructed that patient should call his office if he needs a refill.  Patient was instructed to do so.
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
## 2016-08-29 NOTE — Discharge Instructions (Signed)
In am 4/20, remove zeroform(yellow) packing in wounds (3 small pieces); Irrigate wounds thoroughly with sterile saline, place a small end of a 2x2 into the open wound and cover and place hand back in splint.  Wound should be rinsed / packed at least 3x daily, change more frequently if dressing becomes saturated with drainage.

## 2016-08-29 NOTE — Transfer of Care (Signed)
Immediate Anesthesia Transfer of Care Note  Patient: Mitchell [name]  Procedure(s) Performed: Procedure(s): IRRIGATION AND DEBRIDEMENT OF RIGHT HAND (Right)  Patient Location: PACU  Anesthesia Type:General  Level of Consciousness: awake, alert , oriented and patient cooperative  Airway & Oxygen Therapy: Patient Spontanous Breathing and Patient connected to nasal cannula oxygen  Post-op Assessment: Report given to RN and Post -op Vital signs reviewed and stable  Post vital signs: Reviewed and stable  Last Vitals:  Vitals:   08/29/16 0611 08/29/16 0819  BP: (!) 134/91 (!) 143/76  Pulse: 69 70  Resp: 20 12  Temp: 37.1 C 36.3 C    Last Pain:  Vitals:   08/29/16 0819  TempSrc:   PainSc: (P) Asleep      Patients Stated Pain Goal: 3 (08/29/16 9604)  Complications: No apparent anesthesia complications

## 2016-08-29 NOTE — Progress Notes (Signed)
Pt seen and examined.  Infection of R hand persists. Will I&D.  Pt's questions answered.

## 2016-08-29 NOTE — Anesthesia Postprocedure Evaluation (Signed)
Anesthesia Post Note  Patient: Mitchell [name]  Procedure(s) Performed: Procedure(s) (LRB): IRRIGATION AND DEBRIDEMENT OF RIGHT HAND (Right)  Patient location during evaluation: PACU Anesthesia Type: General Level of consciousness: awake and alert Pain management: pain level controlled Vital Signs Assessment: post-procedure vital signs reviewed and stable Respiratory status: spontaneous breathing, nonlabored ventilation and respiratory function stable Cardiovascular status: blood pressure returned to baseline and stable Postop Assessment: no signs of nausea or vomiting Anesthetic complications: no       Last Vitals:  Vitals:   08/29/16 0850 08/29/16 0905  BP: (!) 117/95 (!) 149/90  Pulse: 74 78  Resp: 16 16  Temp:  36.4 C    Last Pain:  Vitals:   08/29/16 0905  TempSrc:   PainSc: 3                  Athalia Setterlund,W. EDMOND

## 2016-08-30 ENCOUNTER — Encounter (HOSPITAL_COMMUNITY): Payer: Self-pay | Admitting: General Surgery

## 2017-10-11 ENCOUNTER — Emergency Department (HOSPITAL_COMMUNITY)
Admission: EM | Admit: 2017-10-11 | Discharge: 2017-10-11 | Disposition: A | Discharge status: Home / Self Care | Payer: Self-pay | Attending: Emergency Medicine | Admitting: Emergency Medicine

## 2017-10-11 ENCOUNTER — Other Ambulatory Visit: Payer: Self-pay

## 2017-10-11 ENCOUNTER — Encounter (HOSPITAL_COMMUNITY): Payer: Self-pay | Admitting: Emergency Medicine

## 2017-10-11 DIAGNOSIS — F17200 Nicotine dependence, unspecified, uncomplicated: Secondary | ICD-10-CM | POA: Insufficient documentation

## 2017-10-11 DIAGNOSIS — R300 Dysuria: Secondary | ICD-10-CM | POA: Insufficient documentation

## 2017-10-11 DIAGNOSIS — Z113 Encounter for screening for infections with a predominantly sexual mode of transmission: Secondary | ICD-10-CM

## 2017-10-11 DIAGNOSIS — Z202 Contact with and (suspected) exposure to infections with a predominantly sexual mode of transmission: Secondary | ICD-10-CM | POA: Insufficient documentation

## 2017-10-11 LAB — URINALYSIS, ROUTINE W REFLEX MICROSCOPIC
BILIRUBIN URINE: NEGATIVE
GLUCOSE, UA: NEGATIVE mg/dL
HGB URINE DIPSTICK: NEGATIVE
Ketones, ur: NEGATIVE mg/dL
Leukocytes, UA: NEGATIVE
Nitrite: NEGATIVE
PH: 6 (ref 5.0–8.0)
Protein, ur: NEGATIVE mg/dL
Specific Gravity, Urine: 1.015 (ref 1.005–1.030)

## 2017-10-11 LAB — WET PREP, GENITAL
Clue Cells Wet Prep HPF POC: NONE SEEN
Sperm: NONE SEEN
Trich, Wet Prep: NONE SEEN
Yeast Wet Prep HPF POC: NONE SEEN

## 2017-10-11 MED ORDER — CEFTRIAXONE SODIUM 250 MG IJ SOLR
250.0000 mg | Freq: Once | INTRAMUSCULAR | Status: AC
  Administered 2017-10-11: 250 mg via INTRAMUSCULAR
  Filled 2017-10-11: qty 250

## 2017-10-11 MED ORDER — AZITHROMYCIN 250 MG PO TABS
1000.0000 mg | ORAL_TABLET | Freq: Once | ORAL | Status: AC
  Administered 2017-10-11: 1000 mg via ORAL
  Filled 2017-10-11: qty 4

## 2017-10-11 MED ORDER — LIDOCAINE HCL (PF) 1 % IJ SOLN
INTRAMUSCULAR | Status: AC
  Administered 2017-10-11: 2 mL
  Filled 2017-10-11: qty 5

## 2017-10-11 NOTE — ED Provider Notes (Signed)
[facility] Provider Note   CSN: [id number] Arrival date & time: 10/11/17  1134     History   Chief Complaint Chief Complaint  Patient presents with  . Painful Urination    HPI Mitchell MessickBrian D Freshour is a 32 y.o. male with history of tobacco abuse who presents to the emergency department with dysuria this morning.  Patient states that he had unprotected intercourse this a.m. with a new partner.  He states that very quickly after intercourse he urinated and had onset of burning sensation.  This morning lasted about 45 minutes.  He has not had recurrence of the sensation, he has urinated without problems since.  No other specific alleviating or aggravating factors.  No history of similar.  He states he has not had STDs in the past.  Denies testicular pain, testicular swelling, penile discharge, fever, pain with bowel movements, flank pain, or abdominal pain.  HPI  Past Medical History:  Diagnosis Date  . Appendicitis   . Glaucoma     Patient Active Problem List   Diagnosis Date Noted  . Hernia with obstruction 08/16/2013  . S/P laparoscopic appendectomy 08/13/2013    Past Surgical History:  Procedure Laterality Date  . HERNIA REPAIR    . I&D EXTREMITY Right 08/29/2016   Procedure: IRRIGATION AND DEBRIDEMENT OF RIGHT HAND;  Surgeon: Knute NeuHarrill Coley, MD;  Location: [facility] OR;  Service: Plastics;  Laterality: Right;  . LAPAROSCOPIC APPENDECTOMY N/A 08/13/2013   Procedure: APPENDECTOMY LAPAROSCOPIC;  Surgeon: Liz MaladyBurke E Thompson, MD;  [facility];  Service: General;  Laterality: N/A;  . LAPAROTOMY N/A 08/16/2013   Procedure: INCARCERATED ABDOMINAL WALL HERNIA REPAIR;  Surgeon: Cherylynn RidgesJames O Wyatt, MD;  Location: [facility] OR;  Service: General;  Laterality: N/A;        Home Medications    Prior to Admission medications   Medication Sig Start Date End Date Taking? Authorizing Provider  amoxicillin-clavulanate (AUGMENTIN) 875-125 MG tablet Take 1 tablet by mouth  every 12 (twelve) hours. 08/27/16   Long, Arlyss RepressJoshua G, MD  clindamycin (CLEOCIN) 300 MG capsule Take 1 capsule (300 mg total) by mouth 3 (three) times daily. 08/25/16   Azalia Bilisampos, Kevin, MD  HYDROcodone-acetaminophen (NORCO/VICODIN) 5-325 MG tablet Take 1 tablet by mouth every 6 (six) hours as needed for moderate pain. 08/25/16   Azalia Bilisampos, Kevin, MD  oxyCODONE-acetaminophen (PERCOCET/ROXICET) 5-325 MG tablet Take 2 tablets by mouth every 4 (four) hours as needed for severe pain. 08/27/16   Long, Arlyss RepressJoshua G, MD    Family History No family history on file.  Social History Social History   Tobacco Use  . Smoking status: Current Every Day Smoker  . Smokeless tobacco: Never Used  Substance Use Topics  . Alcohol use: Yes    Comment: socially  . Drug use: Yes    Types: Marijuana     Allergies   Patient has no known allergies.   Review of Systems Review of Systems  Constitutional: Negative for chills and fever.  Gastrointestinal: Negative for abdominal pain, anal bleeding, blood in stool, constipation, nausea and vomiting.  Genitourinary: Positive for dysuria. Negative for discharge, flank pain, hematuria, penile swelling, scrotal swelling and testicular pain.   Physical Exam Updated Vital Signs BP (!) 126/98   Pulse 94   Temp 98.3 F (36.8 C) (Oral)   Resp 14   SpO2 98%   Physical Exam  Constitutional: He appears well-developed and well-nourished. No distress.  HENT:  Head: Normocephalic and atraumatic.  Eyes: Conjunctivae are  normal. Right eye exhibits no discharge. Left eye exhibits no discharge.  Cardiovascular: Normal rate and regular rhythm.  Pulmonary/Chest: Effort normal and breath sounds normal. No respiratory distress.  Abdominal: Soft. He exhibits no distension. There is no tenderness. There is no rebound and no guarding.  Genitourinary: Testes normal. Cremasteric reflex is present. Right testis shows no mass and no tenderness. Left testis shows no mass and no tenderness.  Circumcised. No penile erythema or penile tenderness. No discharge found.  Genitourinary Comments: RN Baird Lyons present as chaperone.   Lymphadenopathy: No inguinal adenopathy noted on the right or left side.  Neurological: He is alert.  Clear speech.   Psychiatric: He has a normal mood and affect. His behavior is normal. Thought content normal.  Nursing note and vitals reviewed.   ED Treatments / Results  Labs Results for orders placed or performed during the hospital encounter of 10/11/17  Urinalysis, Routine w reflex microscopic- may I&O cath if menses  Result Value Ref Range   Color, Urine YELLOW YELLOW   APPearance CLEAR CLEAR   Specific Gravity, Urine 1.015 1.005 - 1.030   pH 6.0 5.0 - 8.0   Glucose, UA NEGATIVE NEGATIVE mg/dL   Hgb urine dipstick NEGATIVE NEGATIVE   Bilirubin Urine NEGATIVE NEGATIVE   Ketones, ur NEGATIVE NEGATIVE mg/dL   Protein, ur NEGATIVE NEGATIVE mg/dL   Nitrite NEGATIVE NEGATIVE   Leukocytes, UA NEGATIVE NEGATIVE   No results found. EKG None  Radiology No results found.  Procedures Procedures (including critical care time)  Medications Ordered in ED Medications  azithromycin (ZITHROMAX) tablet 1,000 mg (has no administration in time range)  cefTRIAXone (ROCEPHIN) injection 250 mg (has no administration in time range)    Initial Impression / Assessment and Plan / ED Course  I have reviewed the triage vital signs and the nursing notes.  Pertinent labs & imaging results that were available during my care of the patient were reviewed by me and considered in my medical decision making (see chart for details).  Patient presents with dysuria this AM after unprotected intercourse with new partner.    Nontoxic appearing, no apparent distress, initial tachycardia resolved on my exam and repeat vitals, BP elevated, doubt HTN emergency patient aware of need for recheck. Patient is afebrile without abdominal tenderness, abdominal pain or painful bowel  movements to indicate prostatitis.  No tenderness to palpation of the testes or epididymis to suggest orchitis or epididymitis.  UA unremarkable. STD cultures obtained including HIV, syphilis, gonorrhea and chlamydia. Additionally obtained wet prep.  Patient not willing to wait for wet prep results for possible trich- declining prophylactic metronidazole as he would like to consume EtOH tonight. Additionally refusing to stay for repeat urine for culture.Treated prophylactically in the ED with Azithromycin and Rocephin.  Discussed importance of using protection when sexually active. Patient understands that they have cultures pending and that they will need to inform all sexual partners if results return positive.  I discussed results, treatment plan, need for PCP follow-up, and return precautions with the patient. Provided opportunity for questions, patient confirmed understanding and is in agreement with plan.   Vitals:   10/11/17 1218 10/11/17 1454  BP: (!) 134/101 (!) 126/98  Pulse: (!) 111 94  Resp: 17 14  Temp: 98.3 F (36.8 C)   SpO2: 99% 98%    Final Clinical Impressions(s) / ED Diagnoses   Final diagnoses:  Dysuria  Screen for STD (sexually transmitted disease)    ED Discharge Orders    None  Desmond Lope 10/11/17 1540    Jacalyn Lefevre, MD 10/12/17 [phone]

## 2017-10-11 NOTE — Discharge Instructions (Addendum)
You were seen in the emergency department today for burning with urination.  Your urine did not show signs of a urinary tract infection.  We tested you for gonorrhea, chlamydia, HIV, and syphilis, we will call you with these results.  You have been treated prophylactically for gonorrhea and chlamydia in the emergency department.  If any of your test results return positive we will call you and let you know, you will need to inform all sexual partners.  If the HIV or syphilis return positive you will need to seek treatment.  Follow with the health department in 1 week. Return to the emergency department for new or worsening symptoms including but not limited to fever, vomiting, abdominal pain, back pain, burning with urination that continues, or any other concerns.  Additionally your blood pressure was high in the ER be sure to have this rechecked.

## 2017-10-11 NOTE — ED Notes (Signed)
Pt verbalized understanding discharge instructions and denies any further needs or questions at this time. VS stable, ambulatory and steady gait.   

## 2017-10-11 NOTE — ED Notes (Signed)
Pt did not want his discharge paper work.

## 2017-10-11 NOTE — ED Notes (Signed)
Patient to ED c/o penile burning x 1 hour after having sex earlier this morning, reports burning with urination as well. Denies d/c.

## 2017-10-12 LAB — URINE CULTURE: CULTURE: NO GROWTH

## 2017-10-12 LAB — RPR: RPR Ser Ql: NONREACTIVE

## 2017-10-12 LAB — HIV ANTIBODY (ROUTINE TESTING W REFLEX): HIV SCREEN 4TH GENERATION: NONREACTIVE

## 2017-10-13 LAB — GC/CHLAMYDIA PROBE AMP (~~LOC~~) NOT AT ARMC
Chlamydia: NEGATIVE
Neisseria Gonorrhea: NEGATIVE

## 2018-03-30 ENCOUNTER — Ambulatory Visit (INDEPENDENT_AMBULATORY_CARE_PROVIDER_SITE_OTHER): Payer: No Typology Code available for payment source | Admitting: Family Medicine

## 2018-03-30 ENCOUNTER — Encounter: Payer: Self-pay | Admitting: Family Medicine

## 2018-03-30 VITALS — BP 120/67 | HR 79 | Temp 98.9°F | Ht 70.0 in | Wt 213.4 lb

## 2018-03-30 DIAGNOSIS — K921 Melena: Secondary | ICD-10-CM | POA: Diagnosis not present

## 2018-03-30 DIAGNOSIS — R0683 Snoring: Secondary | ICD-10-CM

## 2018-03-30 DIAGNOSIS — K219 Gastro-esophageal reflux disease without esophagitis: Secondary | ICD-10-CM

## 2018-03-30 DIAGNOSIS — J069 Acute upper respiratory infection, unspecified: Secondary | ICD-10-CM

## 2018-03-30 DIAGNOSIS — R109 Unspecified abdominal pain: Secondary | ICD-10-CM | POA: Diagnosis not present

## 2018-03-30 MED ORDER — PANTOPRAZOLE SODIUM 20 MG PO TBEC: 20.0000 mg | DELAYED_RELEASE_TABLET | Freq: Every day | ORAL | 2 refills | Status: DC

## 2018-03-30 MED FILL — PANTOPRAZOLE SOD DR 20 MG T: 20 | 30 days supply | Qty: 30 | Fill #0
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
## 2018-03-30 NOTE — Patient Instructions (Signed)
Gastroesophageal Reflux Disease, Adult Normally, food travels down the esophagus and stays in the stomach to be digested. However, when a person has gastroesophageal reflux disease (GERD), food and stomach acid move back up into the esophagus. When this happens, the esophagus becomes sore and inflamed. Over time, GERD can create small holes (ulcers) in the lining of the esophagus. What are the causes? This condition is caused by a problem with the muscle between the esophagus and the stomach (lower esophageal sphincter, or LES). Normally, the LES muscle closes after food passes through the esophagus to the stomach. When the LES is weakened or abnormal, it does not close properly, and that allows food and stomach acid to go back up into the esophagus. The LES can be weakened by certain dietary substances, medicines, and medical conditions, including:  Tobacco use.  Pregnancy.  Having a hiatal hernia.  Heavy alcohol use.  Certain foods and beverages, such as coffee, chocolate, onions, and peppermint.  What increases the risk? This condition is more likely to develop in:  People who have an increased body weight.  People who have connective tissue disorders.  People who use NSAID medicines.  What are the signs or symptoms? Symptoms of this condition include:  Heartburn.  Difficult or painful swallowing.  The feeling of having a lump in the throat.  Abitter taste in the mouth.  Bad breath.  Having a large amount of saliva.  Having an upset or bloated stomach.  Belching.  Chest pain.  Shortness of breath or wheezing.  Ongoing (chronic) cough or a night-time cough.  Wearing away of tooth enamel.  Weight loss.  Different conditions can cause chest pain. Make sure to see your health care provider if you experience chest pain. How is this diagnosed? Your health care provider will take a medical history and perform a physical exam. To determine if you have mild or severe  GERD, your health care provider may also monitor how you respond to treatment. You may also have other tests, including:  An endoscopy toexamine your stomach and esophagus with a small camera.  A test thatmeasures the acidity level in your esophagus.  A test thatmeasures how much pressure is on your esophagus.  A barium swallow or modified barium swallow to show the shape, size, and functioning of your esophagus.  How is this treated? The goal of treatment is to help relieve your symptoms and to prevent complications. Treatment for this condition may vary depending on how severe your symptoms are. Your health care provider may recommend:  Changes to your diet.  Medicine.  Surgery.  Follow these instructions at home: Diet  Follow a diet as recommended by your health care provider. This may involve avoiding foods and drinks such as: ? Coffee and tea (with or without caffeine). ? Drinks that containalcohol. ? Energy drinks and sports drinks. ? Carbonated drinks or sodas. ? Chocolate and cocoa. ? Peppermint and mint flavorings. ? Garlic and onions. ? Horseradish. ? Spicy and acidic foods, including peppers, chili powder, curry powder, vinegar, hot sauces, and barbecue sauce. ? Citrus fruit juices and citrus fruits, such as oranges, lemons, and limes. ? Tomato-based foods, such as red sauce, chili, salsa, and pizza with red sauce. ? Fried and fatty foods, such as donuts, french fries, potato chips, and high-fat dressings. ? High-fat meats, such as hot dogs and fatty cuts of red and white meats, such as rib eye steak, sausage, ham, and bacon. ? High-fat dairy items, such as whole milk,   butter, and cream cheese.  Eat small, frequent meals instead of large meals.  Avoid drinking large amounts of liquid with your meals.  Avoid eating meals during the 2-3 hours before bedtime.  Avoid lying down right after you eat.  Do not exercise right after you eat. General  instructions  Pay attention to any changes in your symptoms.  Take over-the-counter and prescription medicines only as told by your health care provider. Do not take aspirin, ibuprofen, or other NSAIDs unless your health care provider told you to do so.  Do not use any tobacco products, including cigarettes, chewing tobacco, and e-cigarettes. If you need help quitting, ask your health care provider.  Wear loose-fitting clothing. Do not wear anything tight around your waist that causes pressure on your abdomen.  Raise (elevate) the head of your bed 6 inches (15cm).  Try to reduce your stress, such as with yoga or meditation. If you need help reducing stress, ask your health care provider.  If you are overweight, reduce your weight to an amount that is healthy for you. Ask your health care provider for guidance about a safe weight loss goal.  Keep all follow-up visits as told by your health care provider. This is important. Contact a health care provider if:  You have new symptoms.  You have unexplained weight loss.  You have difficulty swallowing, or it hurts to swallow.  You have wheezing or a persistent cough.  Your symptoms do not improve with treatment.  You have a hoarse voice. Get help right away if:  You have pain in your arms, neck, jaw, teeth, or back.  You feel sweaty, dizzy, or light-headed.  You have chest pain or shortness of breath.  You vomit and your vomit looks like blood or coffee grounds.  You faint.  Your stool is bloody or black.  You cannot swallow, drink, or eat. This information is not intended to replace advice given to you by your health care provider. Make sure you discuss any questions you have with your health care provider. Document Released: 02/06/2005 Document Revised: 09/27/2015 Document Reviewed: 08/24/2014 Elsevier Interactive Patient Education  2018 [employer]  Gastroesophageal Reflux Disease, Adult Normally, food travels down  the esophagus and stays in the stomach to be digested. However, when a person has gastroesophageal reflux disease (GERD), food and stomach acid move back up into the esophagus. When this happens, the esophagus becomes sore and inflamed. Over time, GERD can create small holes (ulcers) in the lining of the esophagus. What are the causes? This condition is caused by a problem with the muscle between the esophagus and the stomach (lower esophageal sphincter, or LES). Normally, the LES muscle closes after food passes through the esophagus to the stomach. When the LES is weakened or abnormal, it does not close properly, and that allows food and stomach acid to go back up into the esophagus. The LES can be weakened by certain dietary substances, medicines, and medical conditions, including:  Tobacco use.  Pregnancy.  Having a hiatal hernia.  Heavy alcohol use.  Certain foods and beverages, such as coffee, chocolate, onions, and peppermint.  What increases the risk? This condition is more likely to develop in:  People who have an increased body weight.  People who have connective tissue disorders.  People who use NSAID medicines.  What are the signs or symptoms? Symptoms of this condition include:  Heartburn.  Difficult or painful swallowing.  The feeling of having a lump in the throat.    Abitter taste in the mouth.  Bad breath.  Having a large amount of saliva.  Having an upset or bloated stomach.  Belching.  Chest pain.  Shortness of breath or wheezing.  Ongoing (chronic) cough or a night-time cough.  Wearing away of tooth enamel.  Weight loss.  Different conditions can cause chest pain. Make sure to see your health care provider if you experience chest pain. How is this diagnosed? Your health care provider will take a medical history and perform a physical exam. To determine if you have mild or severe GERD, your health care provider may also monitor how you respond to  treatment. You may also have other tests, including:  An endoscopy toexamine your stomach and esophagus with a small camera.  A test thatmeasures the acidity level in your esophagus.  A test thatmeasures how much pressure is on your esophagus.  A barium swallow or modified barium swallow to show the shape, size, and functioning of your esophagus.  How is this treated? The goal of treatment is to help relieve your symptoms and to prevent complications. Treatment for this condition may vary depending on how severe your symptoms are. Your health care provider may recommend:  Changes to your diet.  Medicine.  Surgery.  Follow these instructions at home: Diet  Follow a diet as recommended by your health care provider. This may involve avoiding foods and drinks such as: ? Coffee and tea (with or without caffeine). ? Drinks that containalcohol. ? Energy drinks and sports drinks. ? Carbonated drinks or sodas. ? Chocolate and cocoa. ? Peppermint and mint flavorings. ? Garlic and onions. ? Horseradish. ? Spicy and acidic foods, including peppers, chili powder, curry powder, vinegar, hot sauces, and barbecue sauce. ? Citrus fruit juices and citrus fruits, such as oranges, lemons, and limes. ? Tomato-based foods, such as red sauce, chili, salsa, and pizza with red sauce. ? Fried and fatty foods, such as donuts, french fries, potato chips, and high-fat dressings. ? High-fat meats, such as hot dogs and fatty cuts of red and white meats, such as rib eye steak, sausage, ham, and bacon. ? High-fat dairy items, such as whole milk, butter, and cream cheese.  Eat small, frequent meals instead of large meals.  Avoid drinking large amounts of liquid with your meals.  Avoid eating meals during the 2-3 hours before bedtime.  Avoid lying down right after you eat.  Do not exercise right after you eat. General instructions  Pay attention to any changes in your symptoms.  Take  over-the-counter and prescription medicines only as told by your health care provider. Do not take aspirin, ibuprofen, or other NSAIDs unless your health care provider told you to do so.  Do not use any tobacco products, including cigarettes, chewing tobacco, and e-cigarettes. If you need help quitting, ask your health care provider.  Wear loose-fitting clothing. Do not wear anything tight around your waist that causes pressure on your abdomen.  Raise (elevate) the head of your bed 6 inches (15cm).  Try to reduce your stress, such as with yoga or meditation. If you need help reducing stress, ask your health care provider.  If you are overweight, reduce your weight to an amount that is healthy for you. Ask your health care provider for guidance about a safe weight loss goal.  Keep all follow-up visits as told by your health care provider. This is important. Contact a health care provider if:  You have new symptoms.  You have unexplained weight loss.    You have difficulty swallowing, or it hurts to swallow.  You have wheezing or a persistent cough.  Your symptoms do not improve with treatment.  You have a hoarse voice. Get help right away if:  You have pain in your arms, neck, jaw, teeth, or back.  You feel sweaty, dizzy, or light-headed.  You have chest pain or shortness of breath.  You vomit and your vomit looks like blood or coffee grounds.  You faint.  Your stool is bloody or black.  You cannot swallow, drink, or eat. This information is not intended to replace advice given to you by your health care provider. Make sure you discuss any questions you have with your health care provider. Document Released: 02/06/2005 Document Revised: 09/27/2015 Document Reviewed: 08/24/2014 Elsevier Interactive Patient Education  2018 [employer] Inc.  

## 2018-03-31 ENCOUNTER — Encounter: Payer: Self-pay | Admitting: Family Medicine

## 2018-04-01 ENCOUNTER — Other Ambulatory Visit: Payer: Self-pay | Admitting: Family Medicine

## 2018-04-01 ENCOUNTER — Telehealth: Payer: Self-pay | Admitting: Family Medicine

## 2018-04-01 MED ORDER — AMOXICILLIN-POT CLAVULANATE 875-125 MG PO TABS: 1.0000 | ORAL_TABLET | Freq: Two times a day (BID) | ORAL | 0 refills | Status: AC

## 2018-04-01 NOTE — Telephone Encounter (Signed)
Copied from CRM [id number]. Topic: Quick Communication - See Telephone Encounter >> Apr 01, 2018  9:37 AM Lorrine KinMcGee, Sherice Ijames B, NT wrote: CRM for notification. See Telephone encounter for: 04/01/18. See MyChart Message from 03/31/18--- Patient's wife calling and would like to know if something for a cold could be sent to the pharmacy? States taht Dr Hassan RowanKoberlein told him that if his symptoms did not get better, to call and she would send something. Please advise. [facility] - [place], [place] - [id number]

## 2018-04-01 NOTE — Telephone Encounter (Signed)
Returned phone call to pt's wife.  Advised of the response from Dr. Hassan RowanKoberlein in pt's MyChart:    April 01, 2018  [name], Junell C, MD  to FreeburnMcCLURKIN, [name]        5:03 PM  Cleone SlimHi [name] - I am going to send in an antibiotic for you, but I just want to caution you that I feel your current symptoms might still be viral in nature. My recommendation would be to continue with the over the counter medications for another 48 hours and if worsening or no improvement at that time then start medication. Let me know if you are not feeling better or certainly if worsening after starting medication. (also, sometimes the antibiotics will cause stomach upset, and I know you already have stomach issues, so if we are able to avoid them it is better!)   -Dr. Hassan RowanKoberlein   Wife verb. Understanding of the above.  Encouraged to call back if further questions. Advised pt's wife that the antibiotic was sent to [facility] on [address] instead of [facility].  Verb. Understanding.  Stated she will check [facility] cost at [facility].  Declined having the Rx changed over to [facility] at this time.

## 2018-04-08 ENCOUNTER — Encounter: Payer: Self-pay | Admitting: Internal Medicine

## 2018-04-13 ENCOUNTER — Encounter: Payer: No Typology Code available for payment source | Admitting: Family Medicine

## 2018-04-13 NOTE — Progress Notes (Deleted)
Mitchell [name]' Mitchell [name] DOB: August 14, 1985 Encounter date: 04/13/2018  This is a 32 y.o. male who presents for complete physical   History of present illness/Additional concerns: At last visit 11/18 we referred for sleep study due to snoring, GI due to chronic abdominal pain/GERD/melena and we started trial protonix. We postponed any screening bloodwork due to him having URI.    ***  Past Medical History:  Diagnosis Date  . Appendicitis 2015  . Bowel obstruction (HCC)   . Glaucoma   . H/O left inguinal hernia repair   . Moderate alcohol use disorder in controlled environment (HCC)   . Tobacco dependence    Past Surgical History:  Procedure Laterality Date  . HERNIA REPAIR    . I&D EXTREMITY Right 08/29/2016   Procedure: IRRIGATION AND DEBRIDEMENT OF RIGHT HAND;  Surgeon: Knute Neu, MD;  Location: [facility] OR;  Service: Plastics;  Laterality: Right;  . LAPAROSCOPIC APPENDECTOMY N/A 08/13/2013   Procedure: APPENDECTOMY LAPAROSCOPIC;  Surgeon: Liz Malady, MD;  Location: [facility] OR;  Service: General;  Laterality: N/A;  . LAPAROTOMY N/A 08/16/2013   Procedure: INCARCERATED ABDOMINAL WALL HERNIA REPAIR;  Surgeon: Cherylynn Ridges, MD;  Location: [facility] OR;  Service: General;  Laterality: N/A;   No Known Allergies No outpatient medications have been marked as taking for the 04/13/18 encounter (Appointment) with Mitchell Banker, MD.   Social History   Tobacco Use  . Smoking status: Current Every Day Smoker    Packs/day: 0.75  . Smokeless tobacco: Never Used  Substance Use Topics  . Alcohol use: Yes    Comment: will drink large quantities when he does drink; avoids when working   Family History  Problem Relation Age of Onset  . Migraines Mother   . COPD Father   . Gout Father   . Diabetes Maternal Grandmother      Review of Systems  CBC:  Lab Results  Component Value Date   WBC 11.3 (H) 08/27/2016   HGB 12.9 (L) 08/27/2016   HGB 13.9 04/19/2014   HCT 36.6 (L) 08/27/2016   HCT  41.6 04/19/2014   MCH 31.1 08/27/2016   MCHC 35.2 08/27/2016   RDW 12.0 08/27/2016   RDW 13.3 04/19/2014   PLT 171 08/27/2016   PLT 191 04/19/2014   CMP: Lab Results  Component Value Date   NA 138 08/27/2016   NA 141 04/20/2014   K 4.1 08/27/2016   K 3.6 04/20/2014   CL 99 (L) 08/27/2016   CL 106 04/20/2014   CO2 28 08/27/2016   CO2 29 04/20/2014   ANIONGAP 11 08/27/2016   GLUCOSE 124 (H) 08/27/2016   GLUCOSE 86 04/20/2014   BUN 13 08/27/2016   BUN 8 04/20/2014   CREATININE 1.16 08/27/2016   CREATININE 1.11 04/20/2014   GFRAA >60 08/27/2016   GFRAA >60 04/20/2014   GFRAA >60 08/15/2013   CALCIUM 9.4 08/27/2016   CALCIUM 8.2 (L) 04/20/2014   PROT 6.7 08/27/2016   PROT 6.7 04/20/2014   BILITOT 0.4 08/27/2016   BILITOT 0.7 04/20/2014   ALKPHOS 69 08/27/2016   ALKPHOS 76 04/20/2014   ALT 18 08/27/2016   ALT 19 04/20/2014   AST 25 08/27/2016   AST 27 04/20/2014   LIPID:No results found for: CHOL, TRIG, HDL, LDLCALC, LABVLDL  Objective:  There were no vitals taken for this visit.      BP Readings from Last 3 Encounters:  03/30/18 120/67  10/11/17 (!) 126/98  08/29/16 (!) 149/90   Wt Readings  from Last 3 Encounters:  03/30/18 213 lb 6.4 oz (96.8 kg)  08/29/16 175 lb (79.4 kg)  08/28/16 175 lb (79.4 kg)    Physical Exam  Assessment/Plan: Health Maintenance Due  Topic Date Due  . INFLUENZA VACCINE  12/11/2017   Health Maintenance reviewed - {health maintenance:315237}.  There are no diagnoses linked to this encounter.  No follow-ups on file.  Mitchell ShoveJunell Koberlein, MD

## 2018-04-24 ENCOUNTER — Encounter: Payer: Self-pay | Admitting: Gastroenterology

## 2018-04-27 ENCOUNTER — Ambulatory Visit: Payer: Self-pay | Admitting: Internal Medicine

## 2018-04-27 ENCOUNTER — Encounter: Payer: No Typology Code available for payment source | Admitting: Family Medicine

## 2018-05-11 ENCOUNTER — Ambulatory Visit (INDEPENDENT_AMBULATORY_CARE_PROVIDER_SITE_OTHER): Payer: No Typology Code available for payment source

## 2018-05-11 ENCOUNTER — Other Ambulatory Visit: Payer: Self-pay

## 2018-05-11 ENCOUNTER — Encounter (HOSPITAL_COMMUNITY): Payer: Self-pay

## 2018-05-11 ENCOUNTER — Emergency Department (HOSPITAL_COMMUNITY)
Admission: EM | Admit: 2018-05-11 | Discharge: 2018-05-11 | Discharge status: Absent without leave | Payer: No Typology Code available for payment source

## 2018-05-11 ENCOUNTER — Ambulatory Visit (HOSPITAL_COMMUNITY)
Admission: EM | Admit: 2018-05-11 | Discharge: 2018-05-11 | Disposition: A | Discharge status: Home / Self Care | Payer: No Typology Code available for payment source | Attending: Family Medicine | Admitting: Family Medicine

## 2018-05-11 DIAGNOSIS — J111 Influenza due to unidentified influenza virus with other respiratory manifestations: Secondary | ICD-10-CM

## 2018-05-11 DIAGNOSIS — R509 Fever, unspecified: Secondary | ICD-10-CM

## 2018-05-11 DIAGNOSIS — R05 Cough: Secondary | ICD-10-CM | POA: Diagnosis not present

## 2018-05-11 DIAGNOSIS — R69 Illness, unspecified: Secondary | ICD-10-CM | POA: Diagnosis not present

## 2018-05-11 MED ORDER — ACETAMINOPHEN 325 MG PO TABS
ORAL_TABLET | ORAL | Status: AC
  Filled 2018-05-11: qty 2

## 2018-05-11 MED ORDER — IBUPROFEN 800 MG PO TABS: 800.0000 mg | ORAL_TABLET | Freq: Three times a day (TID) | ORAL | 0 refills | Status: DC

## 2018-05-11 MED ORDER — BENZONATATE 200 MG PO CAPS: 200.0000 mg | ORAL_CAPSULE | Freq: Two times a day (BID) | ORAL | 0 refills | Status: DC | PRN

## 2018-05-11 MED ORDER — ACETAMINOPHEN 325 MG PO TABS
650.0000 mg | ORAL_TABLET | Freq: Once | ORAL | Status: AC
  Administered 2018-05-11: 650 mg via ORAL

## 2018-05-11 NOTE — Discharge Instructions (Addendum)
Rest Push fluids Take the tessalon 2-3 times a day for cough Take ibuprofen 3 x a day with food - For pain and fever

## 2018-05-11 NOTE — ED Triage Notes (Signed)
Pt cc cough, fever , congestion, diarrhea and body aches x 3 days

## 2018-05-11 NOTE — ED Provider Notes (Signed)
[facility]    CSN: [id number] Arrival date & time: 05/11/18  1559     History   Chief Complaint Chief Complaint  Patient presents with  . Cough    HPI Mitchell [name] is a 32 y.o. male.   HPI  Patient is here for cough and fever.  He has some body aches.  He coughs so hard that he has been vomiting.  He feels like he cannot take a deep breath.  His chest hurts from all the coughing.  This is been going on for 3 days.  No runny or stuffy nose.  He has had 1 or 2 loose bowels.  No vomiting if he is not having a coughing spells, no nausea.  He is eating and drinking normally.  No known exposure to influenza, pneumonia, strep.  He has 5 children.  He states currently they are well  Past Medical History:  Diagnosis Date  . Appendicitis 2015  . Bowel obstruction (HCC)   . Glaucoma   . H/O left inguinal hernia repair   . Moderate alcohol use disorder in controlled environment (HCC)   . Tobacco dependence     Patient Active Problem List   Diagnosis Date Noted  . Hernia with obstruction 08/16/2013  . S/P laparoscopic appendectomy 08/13/2013    Past Surgical History:  Procedure Laterality Date  . HERNIA REPAIR    . I&D EXTREMITY Right 08/29/2016   Procedure: IRRIGATION AND DEBRIDEMENT OF RIGHT HAND;  Surgeon: Knute Neu, MD;  Location: [facility];  Service: Plastics;  Laterality: Right;  . LAPAROSCOPIC APPENDECTOMY N/A 08/13/2013   Procedure: APPENDECTOMY LAPAROSCOPIC;  Surgeon: Liz Malady, MD;  Location: [facility] OR;  Service: General;  Laterality: N/A;  . LAPAROTOMY N/A 08/16/2013   Procedure: INCARCERATED ABDOMINAL WALL HERNIA REPAIR;  Surgeon: Cherylynn Ridges, MD;  Location: [facility] OR;  Service: General;  Laterality: N/A;       Home Medications    Prior to Admission medications   Medication Sig Start Date End Date Taking? Authorizing Provider  benzonatate (TESSALON) 200 MG capsule Take 1 capsule (200 mg total) by mouth 2 (two) times daily as needed for cough.  05/11/18   Eustace Moore, MD  ibuprofen (ADVIL,MOTRIN) 800 MG tablet Take 1 tablet (800 mg total) by mouth 3 (three) times daily. 05/11/18   Eustace Moore, MD  pantoprazole (PROTONIX) 20 MG tablet Take 1 tablet (20 mg total) by mouth daily. 03/30/18   Wynn Banker, MD    Family History Family History  Problem Relation Age of Onset  . Migraines Mother   . COPD Father   . Gout Father   . Diabetes Maternal Grandmother     Social History Social History   Tobacco Use  . Smoking status: Current Every Day Smoker    Packs/day: 0.75  . Smokeless tobacco: Never Used  Substance Use Topics  . Alcohol use: Yes    Comment: will drink large quantities when he does drink; avoids when working  . Drug use: Yes    Types: Marijuana     Allergies   Patient has no known allergies.   Review of Systems Review of Systems  Constitutional: Positive for chills, fatigue and fever.  HENT: Negative for ear pain and sore throat.   Eyes: Negative for pain and visual disturbance.  Respiratory: Positive for cough and chest tightness. Negative for shortness of breath.   Cardiovascular: Positive for chest pain. Negative for palpitations.  Gastrointestinal: Negative for abdominal  pain and vomiting.  Genitourinary: Negative for dysuria and hematuria.  Musculoskeletal: Negative for arthralgias and back pain.  Skin: Negative for color change and rash.  Neurological: Negative for seizures and syncope.  All other systems reviewed and are negative.    Physical Exam Triage Vital Signs ED Triage Vitals  Enc Vitals Group     BP 05/11/18 1644 117/72     Pulse Rate 05/11/18 1644 96     Resp 05/11/18 1644 18     Temp 05/11/18 1644 (!) 102.4 F (39.1 C)     Temp src --      SpO2 05/11/18 1644 100 %     Weight 05/11/18 1640 213 lb (96.6 kg)     Height --      Head Circumference --      Peak Flow --      Pain Score 05/11/18 1640 7     Pain Loc --      Pain Edu? --      Excl. in GC? --     No data found.  Updated Vital Signs BP 117/72 (BP Location: Right Arm)   Pulse 96   Temp (!) 102.4 F (39.1 C)   Resp 18   Wt 96.6 kg   SpO2 100%   BMI 30.56 kg/m      Physical Exam Constitutional:      General: He is not in acute distress.    Appearance: He is well-developed and normal weight. He is ill-appearing.  HENT:     Head: Normocephalic and atraumatic.     Right Ear: Tympanic membrane and ear canal normal.     Left Ear: Tympanic membrane and ear canal normal.     Nose: Nose normal.  Eyes:     Conjunctiva/sclera: Conjunctivae normal.     Pupils: Pupils are equal, round, and reactive to light.  Neck:     Musculoskeletal: Normal range of motion.  Cardiovascular:     Rate and Rhythm: Normal rate and regular rhythm.     Pulses: Normal pulses.  Pulmonary:     Effort: Pulmonary effort is normal. No respiratory distress.     Comments: he resists taking full breath, faint rales in right middle lung field, no wheeze Abdominal:     General: Abdomen is flat. There is no distension.     Palpations: Abdomen is soft.     Tenderness: There is no abdominal tenderness.  Musculoskeletal: Normal range of motion.  Lymphadenopathy:     Cervical: No cervical adenopathy.  Skin:    General: Skin is warm and dry.  Neurological:     General: No focal deficit present.     Mental Status: He is alert. Mental status is at baseline.  Psychiatric:        Mood and Affect: Mood normal.        Thought Content: Thought content normal.      UC Treatments / Results  Labs (all labs ordered are listed, but only abnormal results are displayed) Labs Reviewed - No data to display  EKG None  Radiology Dg Chest 2 View  Result Date: 05/11/2018 CLINICAL DATA:  Cough and fever EXAM: CHEST - 2 VIEW COMPARISON:  February 15, 2014 FINDINGS: There is no edema or consolidation. Heart size and pulmonary vascularity are normal. No adenopathy. No bone lesions. IMPRESSION: No edema or  consolidation. Electronically Signed   By: Bretta BangWilliam  Woodruff III M.D.   On: 05/11/2018 17:50    Procedures Procedures (including critical care  time)  Medications Ordered in UC Medications  acetaminophen (TYLENOL) tablet 650 mg (650 mg Oral Given 05/11/18 1702)    Initial Impression / Assessment and Plan / UC Course  I have reviewed the triage vital signs and the nursing notes.  Pertinent labs & imaging results that were available during my care of the patient were reviewed by me and considered in my medical decision making (see chart for details).     Influenza-like illness.  He has beyond treatment with Tamiflu.  He is able to eat and drink normally.  His biggest concern is his coughing spells.  This is treated with Tessalon.  Chest x-ray is normal. Final Clinical Impressions(s) / UC Diagnoses   Final diagnoses:  Influenza-like illness     Discharge Instructions     Rest Push fluids Take the tessalon 2-3 times a day for cough Take ibuprofen 3 x a day with food - For pain and fever      ED Prescriptions    Medication Sig Dispense Auth. Provider   ibuprofen (ADVIL,MOTRIN) 800 MG tablet Take 1 tablet (800 mg total) by mouth 3 (three) times daily. 21 tablet Eustace MooreNelson, Kino Dunsworth Sue, MD   benzonatate (TESSALON) 200 MG capsule Take 1 capsule (200 mg total) by mouth 2 (two) times daily as needed for cough. 20 capsule Eustace MooreNelson, Toshie Demelo Sue, MD     Controlled Substance Prescriptions [place] Controlled Substance Registry consulted? Not Applicable   Eustace MooreNelson, Eliakim Tendler Sue, MD 05/11/18 [phone]

## 2018-05-19 ENCOUNTER — Other Ambulatory Visit: Payer: Self-pay | Admitting: Family Medicine

## 2018-05-19 MED ORDER — METRONIDAZOLE 500 MG PO TABS: ORAL_TABLET | ORAL | 0 refills | Status: DC

## 2018-05-22 ENCOUNTER — Encounter: Payer: No Typology Code available for payment source | Admitting: Family Medicine

## 2018-05-27 ENCOUNTER — Ambulatory Visit: Payer: Self-pay | Admitting: Gastroenterology

## 2018-05-27 ENCOUNTER — Encounter

## 2018-05-28 ENCOUNTER — Institutional Professional Consult (permissible substitution): Payer: Self-pay | Admitting: Pulmonary Disease

## 2018-06-24 ENCOUNTER — Other Ambulatory Visit: Payer: Self-pay | Admitting: Family Medicine

## 2018-06-24 MED ORDER — METRONIDAZOLE 500 MG PO TABS: ORAL_TABLET | ORAL | 0 refills | Status: DC

## 2018-08-02 ENCOUNTER — Encounter: Payer: Self-pay | Admitting: Family Medicine

## 2018-08-03 ENCOUNTER — Other Ambulatory Visit: Payer: Self-pay

## 2018-08-03 ENCOUNTER — Encounter: Payer: Self-pay | Admitting: Family Medicine

## 2018-08-03 ENCOUNTER — Telehealth: Payer: Self-pay

## 2018-08-03 ENCOUNTER — Ambulatory Visit (INDEPENDENT_AMBULATORY_CARE_PROVIDER_SITE_OTHER): Payer: No Typology Code available for payment source | Admitting: Family Medicine

## 2018-08-03 VITALS — BP 100/60 | HR 96 | Temp 98.6°F | Ht 70.0 in | Wt 203.6 lb

## 2018-08-03 DIAGNOSIS — R51 Headache: Secondary | ICD-10-CM | POA: Diagnosis not present

## 2018-08-03 DIAGNOSIS — Z0000 Encounter for general adult medical examination without abnormal findings: Secondary | ICD-10-CM

## 2018-08-03 DIAGNOSIS — Z202 Contact with and (suspected) exposure to infections with a predominantly sexual mode of transmission: Secondary | ICD-10-CM

## 2018-08-03 DIAGNOSIS — Z13220 Encounter for screening for lipoid disorders: Secondary | ICD-10-CM | POA: Diagnosis not present

## 2018-08-03 DIAGNOSIS — K2960 Other gastritis without bleeding: Secondary | ICD-10-CM | POA: Insufficient documentation

## 2018-08-03 DIAGNOSIS — R519 Headache, unspecified: Secondary | ICD-10-CM

## 2018-08-03 LAB — CBC WITH DIFFERENTIAL/PLATELET
BASOS ABS: 0 10*3/uL (ref 0.0–0.1)
Basophils Relative: 0.3 % (ref 0.0–3.0)
Eosinophils Absolute: 0.2 10*3/uL (ref 0.0–0.7)
Eosinophils Relative: 3.2 % (ref 0.0–5.0)
HCT: 41.7 % (ref 39.0–52.0)
Hemoglobin: 14.3 g/dL (ref 13.0–17.0)
Lymphocytes Relative: 38.9 % (ref 12.0–46.0)
Lymphs Abs: 2.8 10*3/uL (ref 0.7–4.0)
MCHC: 34.3 g/dL (ref 30.0–36.0)
MCV: 89.9 fl (ref 78.0–100.0)
Monocytes Absolute: 0.4 10*3/uL (ref 0.1–1.0)
Monocytes Relative: 5.5 % (ref 3.0–12.0)
Neutro Abs: 3.7 10*3/uL (ref 1.4–7.7)
Neutrophils Relative %: 52.1 % (ref 43.0–77.0)
Platelets: 194 10*3/uL (ref 150.0–400.0)
RBC: 4.64 Mil/uL (ref 4.22–5.81)
RDW: 12.9 % (ref 11.5–15.5)
WBC: 7.2 10*3/uL (ref 4.0–10.5)

## 2018-08-03 LAB — LIPID PANEL
CHOL/HDL RATIO: 4
Cholesterol: 223 mg/dL — ABNORMAL HIGH (ref 0–200)
HDL: 51.2 mg/dL (ref >39.00)
LDL Cholesterol: 157 mg/dL — ABNORMAL HIGH (ref 0–99)
NONHDL: 171.92
Triglycerides: 77 mg/dL (ref 0.0–149.0)
VLDL: 15.4 mg/dL (ref 0.0–40.0)

## 2018-08-03 LAB — COMPREHENSIVE METABOLIC PANEL
ALT: 15 U/L (ref 0–53)
AST: 18 U/L (ref 0–37)
Albumin: 4.2 g/dL (ref 3.5–5.2)
Alkaline Phosphatase: 68 U/L (ref 39–117)
BUN: 13 mg/dL (ref 6–23)
CO2: 26 mEq/L (ref 19–32)
Calcium: 9.2 mg/dL (ref 8.4–10.5)
Chloride: 106 mEq/L (ref 96–112)
Creatinine, Ser: 0.97 mg/dL (ref 0.40–1.50)
GFR: 107.18 mL/min (ref >60.00)
Glucose, Bld: 80 mg/dL (ref 70–99)
Potassium: 4.2 mEq/L (ref 3.5–5.1)
Sodium: 140 mEq/L (ref 135–145)
TOTAL PROTEIN: 6.8 g/dL (ref 6.0–8.3)
Total Bilirubin: 0.6 mg/dL (ref 0.2–1.2)

## 2018-08-03 NOTE — Progress Notes (Signed)
Mitchell [name] DOB: Jan 31, 1986 Encounter date: 08/03/2018  This is a 33 y.o. male who presents for complete physical   History of present illness/Additional concerns: "I'm doing ok". Feels alright.   Wonders about getting retested for STD; partner positive for trich x 2. He has not had known STD exposure. We did treat him x 2 for the trich since partner tested positive.  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 He does not touch freight.   Stomach has been better; not bothering him as much as it was before. Didn't follow up with the GI doctor; he got new job and couldn't make it to appointment. He is taking protonix. Wasn't as good about taking it regularly; but has been for the last few weeks.   Didn't complete sleep study; also conflicted with new work schedule.     Past Medical History:  Diagnosis Date  . Appendicitis 2015  . Bowel obstruction (HCC)   . Glaucoma   . H/O left inguinal hernia repair   . Moderate alcohol use disorder in controlled environment (HCC)   . Tobacco dependence    Past Surgical History:  Procedure Laterality Date  . HERNIA REPAIR    . I&D EXTREMITY Right 08/29/2016   Procedure: IRRIGATION AND DEBRIDEMENT OF RIGHT HAND;  Surgeon: Knute Neu, MD;  [facility];  Service: Plastics;  Laterality: Right;  . LAPAROSCOPIC APPENDECTOMY N/A 08/13/2013   Procedure: APPENDECTOMY LAPAROSCOPIC;  Surgeon: Liz Malady, MD;  Location: [facility] OR;  Service: General;  Laterality: N/A;  . LAPAROTOMY N/A 08/16/2013   Procedure: INCARCERATED ABDOMINAL WALL HERNIA REPAIR;  Surgeon: Cherylynn Ridges, MD;  [facility];  Service: General;  Laterality: N/A;   No Known Allergies Current Meds  Medication Sig  . benzonatate (TESSALON) 200 MG capsule Take 1 capsule (200 mg total) by mouth 2 (two) times daily as needed for cough.  Marland Kitchen ibuprofen (ADVIL,MOTRIN) 800 MG tablet Take 1 tablet (800 mg total) by mouth 3 (three) times daily.  .  metroNIDAZOLE (FLAGYL) 500 MG tablet Take all 4 tablets at once for one dose.  . pantoprazole (PROTONIX) 20 MG tablet Take 1 tablet (20 mg total) by mouth daily.   Social History   Tobacco Use  . Smoking status: Current Every Day Smoker    Packs/day: 0.75  . Smokeless tobacco: Never Used  Substance Use Topics  . Alcohol use: Yes    Comment: will drink large quantities when he does drink; avoids when working   Family History  Problem Relation Age of Onset  . Migraines Mother   . COPD Father   . Gout Father   . Diabetes Maternal Grandmother      Review of Systems  Constitutional: Negative for activity change, appetite change, chills, fatigue, fever and unexpected weight change.  HENT: Negative for congestion, ear pain, hearing loss, sinus pressure, sinus pain, sore throat and trouble swallowing.   Eyes: Negative for pain and visual disturbance.  Respiratory: Negative for cough, chest tightness, shortness of breath and wheezing.   Cardiovascular: Negative for chest pain, palpitations and leg swelling.  Gastrointestinal: Negative for abdominal distention, abdominal pain, blood in stool, constipation, diarrhea, nausea and vomiting.  Genitourinary: Negative for decreased urine volume, difficulty urinating, dysuria, penile pain and testicular pain.  Musculoskeletal: Negative for arthralgias, back pain and joint swelling.  Skin: Negative for rash.  Neurological: Negative for dizziness, weakness, numbness and headaches.  Hematological: Negative for adenopathy. Does not bruise/bleed easily.  Psychiatric/Behavioral:  Negative for agitation, sleep disturbance and suicidal ideas. The patient is not nervous/anxious.     CBC:  Lab Results  Component Value Date   WBC 11.3 (H) 08/27/2016   HGB 12.9 (L) 08/27/2016   HGB 13.9 04/19/2014   HCT 36.6 (L) 08/27/2016   HCT 41.6 04/19/2014   MCH 31.1 08/27/2016   MCHC 35.2 08/27/2016   RDW 12.0 08/27/2016   RDW 13.3 04/19/2014   PLT 171  08/27/2016   PLT 191 04/19/2014   CMP: Lab Results  Component Value Date   NA 138 08/27/2016   NA 141 04/20/2014   K 4.1 08/27/2016   K 3.6 04/20/2014   CL 99 (L) 08/27/2016   CL 106 04/20/2014   CO2 28 08/27/2016   CO2 29 04/20/2014   ANIONGAP 11 08/27/2016   GLUCOSE 124 (H) 08/27/2016   GLUCOSE 86 04/20/2014   BUN 13 08/27/2016   BUN 8 04/20/2014   CREATININE 1.16 08/27/2016   CREATININE 1.11 04/20/2014   GFRAA >60 08/27/2016   GFRAA >60 04/20/2014   GFRAA >60 08/15/2013   CALCIUM 9.4 08/27/2016   CALCIUM 8.2 (L) 04/20/2014   PROT 6.7 08/27/2016   PROT 6.7 04/20/2014   BILITOT 0.4 08/27/2016   BILITOT 0.7 04/20/2014   ALKPHOS 69 08/27/2016   ALKPHOS 76 04/20/2014   ALT 18 08/27/2016   ALT 19 04/20/2014   AST 25 08/27/2016   AST 27 04/20/2014   LIPID:No results found for: CHOL, TRIG, HDL, LDLCALC, LABVLDL  Objective:  BP 100/60 (BP Location: Left Arm, Patient Position: Sitting, Cuff Size: Normal)   Pulse 96   Temp 98.6 F (37 C) (Oral)   Ht 5\' 10"  (1.778 m)   Wt 203 lb 9.6 oz (92.4 kg)   SpO2 94%   BMI 29.21 kg/m   Weight: 203 lb 9.6 oz (92.4 kg)   BP Readings from Last 3 Encounters:  08/03/18 100/60  05/11/18 117/72  03/30/18 120/67   Wt Readings from Last 3 Encounters:  08/03/18 203 lb 9.6 oz (92.4 kg)  05/11/18 213 lb (96.6 kg)  03/30/18 213 lb 6.4 oz (96.8 kg)    Physical Exam Constitutional:      General: He is not in acute distress.    Appearance: He is well-developed.  HENT:     Head: Normocephalic and atraumatic.     Right Ear: External ear normal.     Left Ear: External ear normal.     Nose: Nose normal.     Mouth/Throat:     Pharynx: No oropharyngeal exudate.  Eyes:     Conjunctiva/sclera:     Right eye: Right conjunctiva is injected.     Left eye: Left conjunctiva is injected.     Pupils: Pupils are equal, round, and reactive to light.  Neck:     Musculoskeletal: Neck supple.     Thyroid: No thyromegaly.  Cardiovascular:      Rate and Rhythm: Normal rate and regular rhythm.     Heart sounds: Normal heart sounds. No murmur. No friction rub. No gallop.   Pulmonary:     Effort: Pulmonary effort is normal. No respiratory distress.     Breath sounds: Normal breath sounds. No stridor. No wheezing or rales.  Abdominal:     General: Bowel sounds are normal.     Palpations: Abdomen is soft.  Musculoskeletal: Normal range of motion.  Skin:    General: Skin is warm and dry.  Neurological:     Mental Status: He  is alert and oriented to person, place, and time.  Psychiatric:        Behavior: Behavior normal.        Thought Content: Thought content normal.        Judgment: Judgment normal.     Assessment/Plan: Health Maintenance Due  Topic Date Due  . INFLUENZA VACCINE  12/11/2017   Health Maintenance reviewed.  1. Preventative health care Discussed limiting alcohol and being mindful of intake per episode of drinking.   2. STD exposure Was negative on last eval in June 2019; but partner has recurrently tested positive for trichomonas. Swab obtained in office today. - Chlamydia/Gonococcus/Trichomonas, NAA - HIV Antibody (routine testing w rflx); Future - RPR; Future - Hepatitis B surface antibody,qualitative; Future - Hep C Antibody  3. Nonintractable headache, unspecified chronicity pattern, unspecified headache type Headache x 3 days s/p significant alcohol use. Fluids, rest and let us know if not improving. - CBC with Differential/Platelet; Future - Comprehensive metabolic panel; Future  4. Lipid screening - Lipid panel; Future  5. Reflux gastritis Has started on protonix; continue this.  Return in about 1 year (around 08/03/2019) for physical exam.  Theodis Shove, MD

## 2018-08-03 NOTE — Patient Instructions (Addendum)
Tylenol, fluids, rest for headaches. Let me know if headache worsens. We will check in on you once we have bloodwork results.  Preventive Care 18-39 Years, Male Preventive care refers to lifestyle choices and visits with your health care provider that can promote health and wellness. What does preventive care include?   A yearly physical exam. This is also called an annual well check.  Dental exams once or twice a year.  Routine eye exams. Ask your health care provider how often you should have your eyes checked.  Personal lifestyle choices, including: ? Daily care of your teeth and gums. ? Regular physical activity. ? Eating a healthy diet. ? Avoiding tobacco and drug use. ? Limiting alcohol use. ? Practicing safe sex. What happens during an annual well check? The services and screenings done by your health care provider during your annual well check will depend on your age, overall health, lifestyle risk factors, and family history of disease. Counseling Your health care provider may ask you questions about your:  Alcohol use.  Tobacco use.  Drug use.  Emotional well-being.  Home and relationship well-being.  Sexual activity.  Eating habits.  Work and work Statistician. Screening You may have the following tests or measurements:  Height, weight, and BMI.  Blood pressure.  Lipid and cholesterol levels. These may be checked every 5 years starting at age 67.  Diabetes screening. This is done by checking your blood sugar (glucose) after you have not eaten for a while (fasting).  Skin check.  Hepatitis C blood test.  Hepatitis B blood test.  Sexually transmitted disease (STD) testing. Discuss your test results, treatment options, and if necessary, the need for more tests with your health care provider. Vaccines Your health care provider may recommend certain vaccines, such as:  Influenza vaccine. This is recommended every year.  Tetanus, diphtheria, and  acellular pertussis (Tdap, Td) vaccine. You may need a Td booster every 10 years.  Varicella vaccine. You may need this if you have not been vaccinated.  HPV vaccine. If you are 15 or younger, you may need three doses over 6 months.  Measles, mumps, and rubella (MMR) vaccine. You may need at least one dose of MMR.You may also need a second dose.  Pneumococcal 13-valent conjugate (PCV13) vaccine. You may need this if you have certain conditions and have not been vaccinated.  Pneumococcal polysaccharide (PPSV23) vaccine. You may need one or two doses if you smoke cigarettes or if you have certain conditions.  Meningococcal vaccine. One dose is recommended if you are age 44-21 years and a first-year college student living in a residence hall, or if you have one of several medical conditions. You may also need additional booster doses.  Hepatitis A vaccine. You may need this if you have certain conditions or if you travel or work in places where you may be exposed to hepatitis A.  Hepatitis B vaccine. You may need this if you have certain conditions or if you travel or work in places where you may be exposed to hepatitis B.  Haemophilus influenzae type b (Hib) vaccine. You may need this if you have certain risk factors. Talk to your health care provider about which screenings and vaccines you need and how often you need them. This information is not intended to replace advice given to you by your health care provider. Make sure you discuss any questions you have with your health care provider. Document Released: 06/25/2001 Document Revised: 12/10/2016 Document Reviewed: 02/28/2015 Elsevier Interactive  Patient Education  2019 [employer].

## 2018-08-03 NOTE — Telephone Encounter (Signed)
All well patients are being rescheduled for 2 weeks out. Left message for patient to call back and reschedule.

## 2018-08-04 LAB — RPR: RPR Ser Ql: NONREACTIVE

## 2018-08-04 LAB — HEPATITIS B SURFACE ANTIBODY,QUALITATIVE: HEP B S AB: REACTIVE — AB

## 2018-08-04 LAB — HEPATITIS C ANTIBODY
Hepatitis C Ab: NONREACTIVE
SIGNAL TO CUT-OFF: 0.01 (ref <1.00)

## 2018-08-04 LAB — HIV ANTIBODY (ROUTINE TESTING W REFLEX): HIV 1&2 Ab, 4th Generation: NONREACTIVE

## 2018-08-05 ENCOUNTER — Encounter: Payer: Self-pay | Admitting: Family Medicine

## 2018-08-05 LAB — CHLAMYDIA/GONOCOCCUS/TRICHOMONAS, NAA
Chlamydia by NAA: NEGATIVE
Gonococcus by NAA: NEGATIVE
Trich vag by NAA: NEGATIVE

## 2019-08-02 ENCOUNTER — Encounter: Payer: Self-pay | Admitting: Family Medicine

## 2019-08-03 NOTE — Telephone Encounter (Signed)
I see one opening on the 7th? If that doesn't work then we could order labs for this burst of time and try to catch the physical in the near future.

## 2019-08-17 ENCOUNTER — Other Ambulatory Visit: Payer: Self-pay

## 2019-08-18 ENCOUNTER — Ambulatory Visit (INDEPENDENT_AMBULATORY_CARE_PROVIDER_SITE_OTHER): Payer: No Typology Code available for payment source | Admitting: Family Medicine

## 2019-08-18 DIAGNOSIS — E785 Hyperlipidemia, unspecified: Secondary | ICD-10-CM | POA: Insufficient documentation

## 2019-08-18 DIAGNOSIS — Z0000 Encounter for general adult medical examination without abnormal findings: Secondary | ICD-10-CM

## 2019-08-18 NOTE — Progress Notes (Signed)
NO SHOW for physical ?

## 2020-02-21 ENCOUNTER — Encounter: Payer: Self-pay | Admitting: Family Medicine

## 2020-02-21 NOTE — Telephone Encounter (Signed)
Spoke with the pts wife and scheduled a virtual appt for 10/13 at 11:30am.

## 2020-02-23 ENCOUNTER — Other Ambulatory Visit: Payer: Self-pay

## 2020-02-23 ENCOUNTER — Telehealth (INDEPENDENT_AMBULATORY_CARE_PROVIDER_SITE_OTHER): Payer: No Typology Code available for payment source | Admitting: Family Medicine

## 2020-02-23 DIAGNOSIS — F3160 Bipolar disorder, current episode mixed, unspecified: Secondary | ICD-10-CM | POA: Diagnosis not present

## 2020-02-23 DIAGNOSIS — F339 Major depressive disorder, recurrent, unspecified: Secondary | ICD-10-CM | POA: Diagnosis not present

## 2020-02-23 DIAGNOSIS — F419 Anxiety disorder, unspecified: Secondary | ICD-10-CM | POA: Diagnosis not present

## 2020-02-23 MED ORDER — QUETIAPINE FUMARATE 25 MG PO TABS: ORAL_TABLET | ORAL | 2 refills | Status: DC

## 2020-02-23 NOTE — Progress Notes (Signed)
Virtual Visit via Telephone Note  I connected with Mitchell [name]  on 02/23/20 at 11:30 AM EDT by telephone and verified that I am speaking with the correct person using two identifiers.   I discussed the limitations, risks, security and privacy concerns of performing an evaluation and management service by telephone and the availability of in person appointments. I also discussed with the patient that there may be a patient responsible charge related to this service. The patient expressed understanding and agreed to proceed.  Location patient: home Location provider:  [facility]  [address] [place], [place] [address]  Participants present for the call: patient, provider Patient did not have a visit in the prior 7 days to address this/these issue(s).   History of Present Illness: States that he finally listened to wife. Anxiety attacks at night; difficulty falling asleep. Going on for years. Not happening every day. Happens every time he drinks; worse with alcohol. Sometimes lasts all night. Has been on amitriptyline in the past. Has been on tegretrol in the past. Doesn't remember other medications he has done. He was diagnosed with bipolar disorder. Staying busy helps him. Most of anxiety is at night. Depression comes and goes. Some days at bottom; some times at top of world.   Drinking about twice a month now; but not doing it as much now due to anxiety.  He can tell that when he drinks anxiety is worse.  In the past, drinking has also led to unwanted behaviors/actions.  Concentration is issue for him; takes longer to get started with what he is doing.   Takes benadryl but only sleeps an hour or so.    Observations/Objective: Patient sounds cheerful and well on the phone. I do not appreciate any SOB. Speech and thought processing are grossly intact. Patient reported vitals:  GAD 7 : Generalized Anxiety Score 02/23/2020  Nervous, Anxious, on Edge 3   Control/stop worrying 3  Worry too much - different things 3  Trouble relaxing 3  Restless 3  Easily annoyed or irritable 3  Afraid - awful might happen 3  Total GAD 7 Score 21      Video Visit from 02/23/2020 in [facility] [place]  PHQ-9 Total Score 21       Assessment and Plan: 1. Anxiety We are going to start him on Seroquel at bedtime with titration up to 50 mg nightly.  I am hopeful that this will help with his anxiety as well as mood stabilization.  I would like to avoid SSRIs since he has had some manic type episodes in the past.  We discussed that Seroquel tends to help with sleep as well, which is an ongoing issue for him.  He has been avoiding alcohol, which I commended him for since, as we discussed, this can greatly affect mood and make treatment of mood much more difficult.  I encouraged him to consider therapy, although he declines at this point.  2. Bipolar affective disorder, mixed (HCC) See above.  I did encourage him to get in with psychiatry, but feel that it would be best for [place] to try and treat his condition in the meanwhile since he is having a difficult time.  Close follow-up with him updating me in 2 weeks time on mood.  Know sooner if any worsening. Discussed new medication(s) today with patient. Discussed potential side effects and patient verbalized understanding.   3. Depression, recurrent (HCC) See above.  Follow Up Instructions:  Return  in about 2 weeks (around 03/08/2020) for Chronic condition visit.   I did not refer this patient for an OV in the next 24 hours for this/these issue(s).  I discussed the assessment and treatment plan with the patient. The patient was provided an opportunity to ask questions and all were answered. The patient agreed with the plan and demonstrated an understanding of the instructions.   The patient was advised to call back or seek an in-person evaluation if the symptoms worsen or if the condition fails to  improve as anticipated.  I provided 35 minutes of non-face-to-face time during this encounter.   Theodis Shove, MD

## 2020-02-29 DIAGNOSIS — F419 Anxiety disorder, unspecified: Secondary | ICD-10-CM | POA: Insufficient documentation

## 2020-02-29 DIAGNOSIS — F339 Major depressive disorder, recurrent, unspecified: Secondary | ICD-10-CM | POA: Insufficient documentation

## 2020-02-29 DIAGNOSIS — F3160 Bipolar disorder, current episode mixed, unspecified: Secondary | ICD-10-CM | POA: Insufficient documentation

## 2020-03-07 ENCOUNTER — Telehealth: Payer: Self-pay | Admitting: *Deleted

## 2020-03-07 NOTE — Telephone Encounter (Signed)
Spoke with the pt and scheduled a follow up virtual visit (per pts preference) on 11/17.

## 2020-03-07 NOTE — Telephone Encounter (Signed)
-----   Message from Wynn Banker, MD sent at 02/23/2020 12:16 PM EDT ----- Needs follow up in 2-4 weeks. If can't do 2 weeks, then he needs to send me mychart update in 2 weeks and do follow up (virtual ok) in 4 weeks.

## 2020-03-29 ENCOUNTER — Telehealth (INDEPENDENT_AMBULATORY_CARE_PROVIDER_SITE_OTHER): Payer: No Typology Code available for payment source | Admitting: Family Medicine

## 2020-03-29 ENCOUNTER — Other Ambulatory Visit: Payer: Self-pay | Admitting: Family Medicine

## 2020-03-29 ENCOUNTER — Other Ambulatory Visit: Payer: Self-pay

## 2020-03-29 ENCOUNTER — Encounter: Payer: Self-pay | Admitting: Family Medicine

## 2020-03-29 ENCOUNTER — Ambulatory Visit: Payer: No Typology Code available for payment source

## 2020-03-29 DIAGNOSIS — F3160 Bipolar disorder, current episode mixed, unspecified: Secondary | ICD-10-CM

## 2020-03-29 DIAGNOSIS — M25511 Pain in right shoulder: Secondary | ICD-10-CM | POA: Diagnosis not present

## 2020-03-29 DIAGNOSIS — G8929 Other chronic pain: Secondary | ICD-10-CM

## 2020-03-29 DIAGNOSIS — F339 Major depressive disorder, recurrent, unspecified: Secondary | ICD-10-CM

## 2020-03-29 DIAGNOSIS — F419 Anxiety disorder, unspecified: Secondary | ICD-10-CM | POA: Diagnosis not present

## 2020-03-29 MED ORDER — QUETIAPINE FUMARATE 100 MG PO TABS: 100.0000 mg | ORAL_TABLET | Freq: Every day | ORAL | 1 refills | Status: DC

## 2020-03-29 MED ORDER — HYDROXYZINE HCL 25 MG PO TABS: 25.0000 mg | ORAL_TABLET | Freq: Three times a day (TID) | ORAL | 2 refills | Status: DC | PRN

## 2020-03-29 MED FILL — QUETIAPINE FUMARATE 100 MG: 100 | 90 days supply | Qty: 90 | Fill #0

## 2020-03-29 MED FILL — hydrOXYzine HCL 25 MG TABS: 25 | 30 days supply | Qty: 90 | Fill #0

## 2020-03-29 NOTE — Progress Notes (Signed)
Virtual Visit via Video Note  I connected with Mitchell [name]  on 03/29/20 at  1:30 PM EST by a video enabled telemedicine application and verified that I am speaking with the correct person using two identifiers.  Location patient: home Location provider: [facility]  [address] [place], [place] [address] Persons participating in the virtual visit: patient, provider  I discussed the limitations of evaluation and management by telemedicine and the availability of in person appointments. The patient expressed understanding and agreed to proceed.   Mitchell [name] DOB: February 22, 1986 Encounter date: 03/29/2020  This is a 34 y.o. male who presents with Chief Complaint  Patient presents with  . Follow-up    History of present illness: Last visit was 10/13 and we started him on seroquel 50mg  to help with anxiety, bipolar, sleep.   Still waking up during night; even when taking 2 pills. Taking benadryl sometimes. If he wakes at 4-5am then medication would make him too tired.   Hard to calm self down at night. Just restless. Feels like he can't stop moving legs; then can sleep. Gets up in middle of night still sometimes. Medication is helping with falling asleep. Taking seroquel 50mg ; sometimes 75mg .   Right shoulder been bothering him for years; but getting worse. Now can't lift over head. Thinks hurt it with lifting. Not weak, but everything hurts. He is right handed. Thinks just the compensation to use right arm more than left with lifting is where it came from. No neck pain, no upper arm pain. Used to wake him from sleep but with seroquel will sleep on shoulder.    No Known Allergies Current Meds  Medication Sig  . [DISCONTINUED] QUEtiapine (SEROQUEL) 25 MG tablet Start with 1 tablet nightly x 7 days, then increase to 2 tablets nightly (Patient taking differently: 50 mg at bedtime. )    Review of Systems  Constitutional: Negative for chills, fatigue and  fever.  Respiratory: Negative for cough, chest tightness, shortness of breath and wheezing.   Cardiovascular: Negative for chest pain, palpitations and leg swelling.  Musculoskeletal: Positive for arthralgias (right shoulder).    Objective:  There were no vitals taken for this visit.      BP Readings from Last 3 Encounters:  08/03/18 100/60  05/11/18 117/72  03/30/18 120/67   Wt Readings from Last 3 Encounters:  08/03/18 203 lb 9.6 oz (92.4 kg)  05/11/18 213 lb (96.6 kg)  03/30/18 213 lb 6.4 oz (96.8 kg)    EXAM:  GENERAL: alert, oriented, appears well and in no acute distress  HEENT: atraumatic, conjunctiva clear, no obvious abnormalities on inspection of external nose and ears  NECK: normal movements of the head and neck  LUNGS: on inspection no signs of respiratory distress, breathing rate appears normal, no obvious gross SOB, gasping or wheezing  CV: no obvious cyanosis  MS: moves all visible extremities without noticeable abnormality  PSYCH/NEURO: pleasant and cooperative, no obvious depression or anxiety, speech and thought processing grossly intact   Assessment/Plan  1. Bipolar affective disorder, mixed (HCC) Increase seroquel to 100mg  nightly. Hoping to help with anxiety, sleep a little more.  2. Anxiety See above. Will also give hydroxyzine as short acting help for anxiety/sleep if wakes at night. Encouraged him to update me by mychart in 1-2 weeks with how this is working.  3. Depression, recurrent (HCC) Stable mood; anxiety is bigger current issue.  4. Chronic right shoulder pain Start with xray; further eval/treatment plan  pending result. This is chronic, but recently worsening. - DG Shoulder Right; Future   Return in about 6 weeks (around 05/10/2020).   I discussed the assessment and treatment plan with the patient. The patient was provided an opportunity to ask questions and all were answered. The patient agreed with the plan and demonstrated an  understanding of the instructions.   The patient was advised to call back or seek an in-person evaluation if the symptoms worsen or if the condition fails to improve as anticipated.  I provided 30 minutes of non-face-to-face time during this encounter.   Theodis Shove, MD

## 2020-07-24 ENCOUNTER — Telehealth: Payer: Self-pay | Admitting: Family Medicine

## 2020-07-24 NOTE — Telephone Encounter (Signed)
Patient called to see if he still has a referral to a GI doctor and I didn't see one so I advised the patient that he will have to make an appointment with Dr. Hassan Rowan to get another referral to GI. The patient didn't want to wait til April 6th for the next available appointment so he decided that he will call back later.

## 2020-07-24 NOTE — Telephone Encounter (Signed)
I am ok with referral to GI. We placed one back in 2019; but I don't think he went. He had blood in his stool at that time. Please just find out current sx and please order so he can follow through with this.

## 2020-07-25 ENCOUNTER — Telehealth: Payer: No Typology Code available for payment source | Admitting: Family Medicine

## 2020-07-25 NOTE — Telephone Encounter (Signed)
Left a message for the pt to return my call.  

## 2020-07-25 NOTE — Telephone Encounter (Signed)
Patients wife called back and was informed of the message below.  She stated the pt has had abdominal pain for the past 2 weeks and denies Covid test being performed and wanted to see Dr Hassan Rowan today.  I advised the pts wife she is not in the office and scheduled a virtual visit with Dr Selena Batten.

## 2020-07-25 NOTE — Progress Notes (Signed)
Patient did not answer multiple calls nor connect by link sent to multiple numbers. 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 Sent message to PCP office.

## 2020-09-25 ENCOUNTER — Encounter: Payer: No Typology Code available for payment source | Admitting: Family Medicine

## 2021-01-03 ENCOUNTER — Ambulatory Visit: Payer: No Typology Code available for payment source | Admitting: Family Medicine

## 2021-03-12 ENCOUNTER — Telehealth: Payer: No Typology Code available for payment source | Admitting: Family

## 2021-03-12 DIAGNOSIS — J069 Acute upper respiratory infection, unspecified: Secondary | ICD-10-CM

## 2021-03-12 MED ORDER — FLUTICASONE PROPIONATE 50 MCG/ACT NA SUSP: 2.0000 | Freq: Every day | NASAL | 6 refills | Status: DC

## 2021-03-12 MED ORDER — BENZONATATE 100 MG PO CAPS: 100.0000 mg | ORAL_CAPSULE | Freq: Three times a day (TID) | ORAL | 0 refills | Status: DC | PRN

## 2021-03-12 NOTE — Progress Notes (Signed)

## 2021-04-07 ENCOUNTER — Telehealth: Payer: No Typology Code available for payment source | Admitting: Emergency Medicine

## 2021-04-07 DIAGNOSIS — R6889 Other general symptoms and signs: Secondary | ICD-10-CM

## 2021-04-07 MED ORDER — OSELTAMIVIR PHOSPHATE 75 MG PO CAPS: 75.0000 mg | ORAL_CAPSULE | Freq: Two times a day (BID) | ORAL | 0 refills | Status: AC

## 2021-04-07 NOTE — Progress Notes (Signed)
I have spent 5 minutes in review of e-visit questionnaire, review and updating patient chart, medical decision making and response to patient.   Maydell Knoebel, PA-C    

## 2021-04-07 NOTE — Progress Notes (Signed)
Hi Mitchell [name], I'm sorry you are not feeling well! It sounds like you more than likely have the flu.  I will send in an antiviral medication to help with your symptoms. Push fluids and get rest.  Continue with OTC medications as needed like ibuprofen and/or tylenol to help with body aches, and fever.  If you are having chest or abdominal pain not related to cough, please seek in person evaluation immediately as those symptoms can be a sign of a life threatening condition.  Please call 911, or go to the nearest urgent care or emergency room for further evaluation if you are truly having chest and/or abdominal pain not related to cough or body aches.  E visit for Flu like symptoms   We are sorry that you are not feeling well.  Here is how we plan to help! Based on what you have shared with me it looks like you may have a respiratory virus that may be influenza.  Influenza or "the flu" is   an infection caused by a respiratory virus. The flu virus is highly contagious and persons who did not receive their yearly flu vaccination may "catch" the flu from close contact.  We have anti-viral medications to treat the viruses that cause this infection. They are not a "cure" and only shorten the course of the infection. These prescriptions are most effective when they are given within the first 2 days of "flu" symptoms. Antiviral medication are indicated if you have a high risk of complications from the flu. You should  also consider an antiviral medication if you are in close contact with someone who is at risk. These medications can help patients avoid complications from the flu  but have side effects that you should know. Possible side effects from Tamiflu or oseltamivir include nausea, vomiting, diarrhea, dizziness, headaches, eye redness, sleep problems or other respiratory symptoms. You should not take Tamiflu if you have an allergy to oseltamivir or any to the ingredients in Tamiflu.  Based upon your symptoms and  potential risk factors I have prescribed Oseltamivir (Tamiflu).  It has been sent to your designated pharmacy.  You will take one 75 mg capsule orally twice a day for the next 5 days.  ANYONE WHO HAS FLU SYMPTOMS SHOULD: Stay home. The flu is highly contagious and going out or to work exposes others! Be sure to drink plenty of fluids. Water is fine as well as fruit juices, sodas and electrolyte beverages. You may want to stay away from caffeine or alcohol. If you are nauseated, try taking small sips of liquids. How do you know if you are getting enough fluid? Your urine should be a pale yellow or almost colorless. Get rest. Taking a steamy shower or using a humidifier may help nasal congestion and ease sore throat pain. Using a saline nasal spray works much the same way. Cough drops, hard candies and sore throat lozenges may ease your cough. Line up a caregiver. Have someone check on you regularly.   GET HELP RIGHT AWAY IF: You cannot keep down liquids or your medications. You become short of breath Your fell like you are going to pass out or loose consciousness. Your symptoms persist after you have completed your treatment plan MAKE SURE YOU  Understand these instructions. Will watch your condition. Will get help right away if you are not doing well or get worse.  Your e-visit answers were reviewed by a board certified advanced clinical practitioner to complete your personal care plan.  Depending on the condition, your plan could have included both over the counter or prescription medications.  If there is a problem please reply  once you have received a response from your provider.  Your safety is important to [place].  If you have drug allergies check your prescription carefully.    You can use MyChart to ask questions about today's visit, request a non-urgent call back, or ask for a work or school excuse for 24 hours related to this e-Visit. If it has been greater than 24 hours you will  need to follow up with your provider, or enter a new e-Visit to address those concerns.  You will get an e-mail in the next two days asking about your experience.  I hope that your e-visit has been valuable and will speed your recovery. Thank you for using e-visits.

## 2021-04-10 ENCOUNTER — Telehealth: Payer: No Typology Code available for payment source | Admitting: Physician Assistant

## 2021-04-10 DIAGNOSIS — B9689 Other specified bacterial agents as the cause of diseases classified elsewhere: Secondary | ICD-10-CM

## 2021-04-10 MED ORDER — AMOXICILLIN-POT CLAVULANATE 875-125 MG PO TABS: 1.0000 | ORAL_TABLET | Freq: Two times a day (BID) | ORAL | 0 refills | Status: DC

## 2021-04-10 NOTE — Progress Notes (Signed)

## 2021-04-10 NOTE — Progress Notes (Signed)
I have spent 5 minutes in review of e-visit questionnaire, review and updating patient chart, medical decision making and response to patient.   Asencion Guisinger Cody Marvelous Bouwens, PA-C    

## 2021-06-13 ENCOUNTER — Other Ambulatory Visit: Payer: Self-pay

## 2021-06-13 ENCOUNTER — Encounter (HOSPITAL_BASED_OUTPATIENT_CLINIC_OR_DEPARTMENT_OTHER): Payer: Self-pay | Admitting: *Deleted

## 2021-06-13 ENCOUNTER — Emergency Department (HOSPITAL_BASED_OUTPATIENT_CLINIC_OR_DEPARTMENT_OTHER)
Admission: EM | Admit: 2021-06-13 | Discharge: 2021-06-13 | Disposition: A | Discharge status: Home / Self Care | Payer: No Typology Code available for payment source | Attending: Emergency Medicine | Admitting: Emergency Medicine

## 2021-06-13 DIAGNOSIS — G8929 Other chronic pain: Secondary | ICD-10-CM | POA: Insufficient documentation

## 2021-06-13 DIAGNOSIS — Z5321 Procedure and treatment not carried out due to patient leaving prior to being seen by health care provider: Secondary | ICD-10-CM | POA: Diagnosis not present

## 2021-06-13 DIAGNOSIS — M545 Low back pain, unspecified: Secondary | ICD-10-CM | POA: Diagnosis present

## 2021-06-13 NOTE — ED Triage Notes (Signed)
Pt is here for chronic lower back pain.  Pt reports that he has had back pain "for years" but that he fell on christmas and his back pain has been worse since.  No numbness or weakness with this

## 2021-06-13 NOTE — ED Notes (Signed)
Pt ambulatory in waiting area, declines coming back to room when called.  He states :I changed my mind, I do not want to pay the $500 copay"

## 2021-06-27 ENCOUNTER — Encounter: Payer: Self-pay | Admitting: Family Medicine

## 2021-06-27 ENCOUNTER — Telehealth (INDEPENDENT_AMBULATORY_CARE_PROVIDER_SITE_OTHER): Payer: No Typology Code available for payment source | Admitting: Family Medicine

## 2021-06-27 DIAGNOSIS — M545 Low back pain, unspecified: Secondary | ICD-10-CM

## 2021-06-27 MED ORDER — DICLOFENAC SODIUM 75 MG PO TBEC: 75.0000 mg | DELAYED_RELEASE_TABLET | Freq: Two times a day (BID) | ORAL | 0 refills | Status: DC
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
## 2021-06-28 ENCOUNTER — Other Ambulatory Visit: Payer: Self-pay

## 2021-06-28 ENCOUNTER — Ambulatory Visit (INDEPENDENT_AMBULATORY_CARE_PROVIDER_SITE_OTHER)
Admission: RE | Admit: 2021-06-28 | Discharge: 2021-06-28 | Disposition: A | Discharge status: Home / Self Care | Payer: No Typology Code available for payment source | Source: Ambulatory Visit | Attending: Family Medicine | Admitting: Family Medicine

## 2021-06-28 DIAGNOSIS — M545 Low back pain, unspecified: Secondary | ICD-10-CM

## 2021-07-02 ENCOUNTER — Other Ambulatory Visit: Payer: Self-pay

## 2021-07-02 DIAGNOSIS — M545 Low back pain, unspecified: Secondary | ICD-10-CM

## 2021-07-04 ENCOUNTER — Telehealth (INDEPENDENT_AMBULATORY_CARE_PROVIDER_SITE_OTHER): Payer: No Typology Code available for payment source | Admitting: Family Medicine

## 2021-07-04 ENCOUNTER — Encounter: Payer: Self-pay | Admitting: Family Medicine

## 2021-07-04 DIAGNOSIS — M4306 Spondylolysis, lumbar region: Secondary | ICD-10-CM | POA: Diagnosis not present

## 2021-07-04 DIAGNOSIS — M545 Low back pain, unspecified: Secondary | ICD-10-CM

## 2021-07-04 NOTE — Progress Notes (Signed)
Virtual Visit via Telephone Note  I connected with Mitchell [name] on 07/04/21 at 10:00 AM EST by telephone and verified that I am speaking with the correct person using two identifiers.   I discussed the limitations, risks, security and privacy concerns of performing an evaluation and management service by telephone and the availability of in person appointments. I also discussed with the patient that there may be a patient responsible charge related to this service. The patient expressed understanding and agreed to proceed.  Location patient: home Location provider:  [facility]  [address] [place], [place] [address]  Participants present for the call: patient, provider Patient did not have a visit in the prior 7 days to address this/these issue(s).   History of Present Illness: Wanted to talk through xray results/plan  Started taking the medication (diclofenac) but without taking tylenol it doesn't do anything. Taking twice daily, but not noted improvement. He is taking 650mg  and has been taking 3 of those in a day. Helps for a couple of hours, then he is in pain. 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 Wakes up with back pain- usually at 5/10. But once he starts driving/bouncing up and down then pain increases. Pain about 8/10 after shift.   When he wakes up feels pain to left in lower back. Through driving shift, then pain becomes more intense. Laying down feels comfortable but doesn't relieve pain. Pain just stuck on left side lower spine.   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   Getting up and down steps and riding seems to aggravate more than anything else.    Observations/Objective: Patient sounds cheerful and well on the phone. I do not appreciate any SOB. Speech and thought processing are grossly intact. Patient reported vitals:  Assessment and Plan: 1. Pars defect of lumbar spine  2. Left-sided  low back pain without sciatica, unspecified chronicity Has been referred to sports med. I do feel that work is not helping with pain/healing (if defect is new?). Diclofenac upset stomach. Ok to take tylenol up to 6 tabs/day for pain. Try lidocaine (salanpas) patches. Will reach out to sports med to see if any other suggestions prior to visit next week, but patient is out of town and won't be back until next week.   Follow Up Instructions: prn   [id number] 5-10 [id number] 11-20 9443 21-30 I did not refer this patient for an OV in the next 24 hours for this/these issue(s).  I discussed the assessment and treatment plan with the patient. The patient was provided an opportunity to ask questions and all were answered. The patient agreed with the plan and demonstrated an understanding of the instructions.   The patient was advised to call back or seek an in-person evaluation if the symptoms worsen or if the condition fails to improve as anticipated.  I provided 15 minutes of non-face-to-face time during this encounter.   , MD

## 2021-07-07 ENCOUNTER — Encounter: Payer: Self-pay | Admitting: Family Medicine
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
## 2021-07-10 ENCOUNTER — Other Ambulatory Visit: Payer: Self-pay | Admitting: Sports Medicine

## 2021-07-10 ENCOUNTER — Telehealth: Payer: Self-pay | Admitting: Sports Medicine

## 2021-07-10 ENCOUNTER — Ambulatory Visit (INDEPENDENT_AMBULATORY_CARE_PROVIDER_SITE_OTHER): Payer: No Typology Code available for payment source | Admitting: Sports Medicine

## 2021-07-10 ENCOUNTER — Ambulatory Visit (INDEPENDENT_AMBULATORY_CARE_PROVIDER_SITE_OTHER): Payer: No Typology Code available for payment source

## 2021-07-10 ENCOUNTER — Other Ambulatory Visit: Payer: Self-pay

## 2021-07-10 VITALS — BP 122/80 | HR 96 | Ht 70.0 in | Wt 220.0 lb

## 2021-07-10 DIAGNOSIS — M545 Low back pain, unspecified: Secondary | ICD-10-CM

## 2021-07-10 DIAGNOSIS — M4306 Spondylolysis, lumbar region: Secondary | ICD-10-CM | POA: Diagnosis not present

## 2021-07-10 MED ORDER — MELOXICAM 15 MG PO TABS: 15.0000 mg | ORAL_TABLET | Freq: Every day | ORAL | 0 refills | Status: DC

## 2021-07-10 NOTE — Telephone Encounter (Signed)
Pt wife called to confirm that insurance does cover Physical Therapy, please put in referral.

## 2021-07-10 NOTE — Telephone Encounter (Signed)
Pt was messaged via my chart , notifying them that a pt referral has been sent in

## 2021-07-10 NOTE — Patient Instructions (Addendum)
Good to see you Start meloxicam 15 mg daily x2 weeks.  If still having pain after 2 weeks, complete 3rd-week of meloxicam. May use remaining meloxicam as needed once daily for pain control.  Do not to use additional NSAIDs while taking meloxicam.  May use Tylenol [phone] mg to 3 times a day for breakthrough pain. Left L5 pars fracture HEP  Call your insurance and see if they cover PT and then call us back and we can refer to physical therapy Recommend no lifting more than 15 pounds 4 week follow up

## 2021-07-16 ENCOUNTER — Other Ambulatory Visit: Payer: Self-pay

## 2021-07-16 ENCOUNTER — Ambulatory Visit: Payer: No Typology Code available for payment source | Attending: Sports Medicine | Admitting: Physical Therapy

## 2021-07-16 ENCOUNTER — Encounter: Payer: Self-pay | Admitting: Physical Therapy

## 2021-07-16 DIAGNOSIS — M4306 Spondylolysis, lumbar region: Secondary | ICD-10-CM | POA: Insufficient documentation

## 2021-07-16 DIAGNOSIS — M6281 Muscle weakness (generalized): Secondary | ICD-10-CM | POA: Insufficient documentation

## 2021-07-16 DIAGNOSIS — M545 Low back pain, unspecified: Secondary | ICD-10-CM | POA: Insufficient documentation

## 2021-07-16 DIAGNOSIS — G8929 Other chronic pain: Secondary | ICD-10-CM | POA: Insufficient documentation

## 2021-07-16 NOTE — Patient Instructions (Signed)
Access Code: [email or web address] ?URL: [email or web address] ?Date: 07/16/2021 ?Prepared by: Rosana Hoes ? ?Exercises ?Supine Posterior Pelvic Tilt - 1-2 x daily - 7 x weekly - 2 sets - 5 reps - 5 seconds hold ?Supine Pelvic Tilt with Straight Leg Raise - 1-2 x daily - 7 x weekly - 3 sets - 5 reps ?Supine Lower Trunk Rotation - 1-2 x daily - 7 x weekly - 10 reps - 5 seconds hold ?Sidelying Hip Abduction - 1-2 x daily - 7 x weekly - 2 sets - 10 reps ?Child's Pose Stretch - 1-2 x daily - 7 x weekly - 3 reps - 20 seconds hold ? ?

## 2021-07-16 NOTE — Therapy (Signed)
OUTPATIENT PHYSICAL THERAPY EVALUATION   Patient Name: Mitchell [name]' Desai MRN: [id number] DOB:1986/05/13, 36 y.o., male Today's Date: 07/16/2021   PT End of Session - 07/16/21 1128     Visit Number 1    Number of Visits 8    Date for PT Re-Evaluation 09/10/21    Authorization Type [facility]OC[facility]    PT Start Time 1130    PT Stop Time 1215    PT Time Calculation (min) 45 min    Activity Tolerance Patient tolerated treatment well    Behavior During Therapy WFL for tasks assessed/performed             Past Medical History:  Diagnosis Date   Appendicitis 2015   Bowel obstruction (HCC)    Glaucoma    H/O left inguinal hernia repair    Moderate alcohol use disorder in controlled environment (HCC)    Tobacco dependence    Past Surgical History:  Procedure Laterality Date   HERNIA REPAIR     I & D EXTREMITY Right 08/29/2016   Procedure: IRRIGATION AND DEBRIDEMENT OF RIGHT HAND;  Surgeon: Knute Neu, MD;  Location: [facility] OR;  Service: Plastics;  Laterality: Right;   LAPAROSCOPIC APPENDECTOMY N/A 08/13/2013   Procedure: APPENDECTOMY LAPAROSCOPIC;  Surgeon: Liz Malady, MD;  Location: [name] OR;  Service: General;  Laterality: N/A;   LAPAROTOMY N/A 08/16/2013   Procedure: INCARCERATED ABDOMINAL WALL HERNIA REPAIR;  Surgeon: Cherylynn Ridges, MD;  Location: [facility] OR;  Service: General;  Laterality: N/A;   Patient Active Problem List   Diagnosis Date Noted   Bipolar affective disorder, mixed (HCC) 02/29/2020   Anxiety 02/29/2020   Depression, recurrent (HCC) 02/29/2020   Hyperlipidemia 08/18/2019   Reflux gastritis 08/03/2018   Hernia with obstruction 08/16/2013   S/P laparoscopic appendectomy 08/13/2013    PCP: Wynn Banker, MD  REFERRING PROVIDER: Richardean Sale, DO  REFERRING DIAG: Acute left-sided low back pain without sciatica  THERAPY DIAG:  Chronic bilateral low back pain, unspecified whether sciatica present  Muscle weakness (generalized)  ONSET DATE:  05/06/2022  SUBJECTIVE:             SUBJECTIVE STATEMENT: Patient reports fractured lumbar spine, he fell on Christmas day. When injury first started he was having spasms, no longer having the spasms but is having consistent pain. He has trouble standing, walking, sitting, or lying down for long periods of time. He is unable to stand or walk longer than 30 minutes. He is currently not working as a [employer] due to bouncing of the seat and inability to perform lifting or bending tasks. He denies any radiating or referred leg pain, no numbness or tingling.   PERTINENT HISTORY:  None  PAIN:  Are you having pain? Yes NPRS scale: 5/10 Pain location: Back Pain orientation: Lower (left > right) PAIN TYPE: Chonic Pain description: Constant, tender, throbbing Aggravating factors: Sitting, standing, walking, lying down too long, bending Relieving factors: Medication  PRECAUTIONS: Lifting limited to < 15 pounds  WEIGHT BEARING RESTRICTIONS No  FALLS:  Has patient fallen in last 6 months? 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 No evidence of lumbar spine fracture. 2. Mild L4-L5 facet hypertrophy. 3. Chronic bilateral L5 pars interarticularis defects, better appreciated on prior CT.  PATIENT  SURVEYS:  FOTO 47% functional status  SCREENING FOR RED FLAGS: Negative  COGNITION: Overall cognitive status: Within functional limits for tasks assessed     SENSATION:  Light touch: Appears intact  MUSCLE LENGTH: Hamstring and hip flexor tightness noted bilaterally  POSTURE:  Rounded shoulder posture, increased lumbar lordosis  PALPATION: Tender to palpation left lumbar paraspinals with increased muscle tension compared to right  LUMBAR  AROM  AROM A/PROM  07/16/2021  Flexion 50%  Extension 50%  Right lateral flexion 50%  Left lateral flexion 25%*  Right rotation 50%  Left rotation 25%   LE AROM/PROM: Hip PROM grossly WFL, increased pulling in left lumbar with left hip flexion  LE MMT:  MMT Right 07/16/2021 Left 07/16/2021  Core 4-  Hip flexion 4 4  Hip extension 4- 3  Hip abduction 4- 4-  Knee flexion 5 5  Knee extension 5 5   LUMBAR SPECIAL TESTS:  Lumbar radicular testing negative  FUNCTIONAL TESTS:  Squat: decreased depth, increased forward trunk lean, bilateral toe out  GAIT: Assistive device utilized: None Level of assistance: Complete Independence Comments: decreased trunk rotation   TODAY'S TREATMENT  Posterior pelvic tilt 5 x 5 sec - hands placed under lower back for cue SLR with pelvic tilt x 5 each - hands placed under lower back for cue LTR 3 x 5 sec each Sidelying hip abduction x 10 each Child's pose stretch 2 x 20 sec  PATIENT EDUCATION:  Education details: Exam findings, POC, HEP Person educated: Patient Education method: Explanation, Demonstration, Tactile cues, Verbal cues, and Handouts Education comprehension: verbalized understanding, returned demonstration, verbal cues required, tactile cues required, and needs further education  HOME EXERCISE PROGRAM: Access Code: [email or web address]   ASSESSMENT: CLINICAL IMPRESSION: Patient is a 36 y.o. male who was seen today for physical therapy evaluation and treatment for chronic low back pain. The majority of his pain seems to be muscular related with left sided increased muscle tension. He also demonstrates deficits with core and hip strength and postural/movement impairments likely placing greater stress on the lumbar spine. Patient provided HEP to initiate core engagement and lumbar mobility, and he would benefit from continued skilled PT to progress his mobility and strength in order to reduce pain and maximize functional ability.   OBJECTIVE  IMPAIRMENTS Abnormal gait, decreased activity tolerance, decreased ROM, decreased strength, impaired flexibility, improper body mechanics, postural dysfunction, and pain.   ACTIVITY LIMITATIONS cleaning, community activity, driving, meal prep, occupation, laundry, yard work, and shopping.   PERSONAL FACTORS Past/current experiences and Time since onset of injury/illness/exacerbation are also affecting patient's functional outcome.    REHAB POTENTIAL: Good  CLINICAL DECISION MAKING: Stable/uncomplicated  EVALUATION COMPLEXITY: Low   GOALS: Goals reviewed with patient? Yes  SHORT TERM GOALS:  Patient will be I with initial HEP in order to progress with therapy. Baseline: provided at eval Target date: 08/13/2021 Goal status: INITIAL  2.  PT will review FOTO with patient by 3rd visit in order to understand expected progress and outcome with therapy. Baseline: assessed at eval Target date: 08/13/2021 Goal status: INITIAL  3.  Patient will report pain level </= 3/10 in order to reduce functional limitations and improve activity tolerance. Baseline: 5/10 pain at rest Target date: 08/13/2021 Goal status: INITIAL  LONG TERM GOALS:  Patient will be I with final HEP to maintain progress from PT. Baseline: provided at eval Target date: 09/10/2021 Goal status: INITIAL  2.  Patient will report >/= 63% status on FOTO to indicate improved  functional ability. Baseline: 47% functional status Target date: 09/10/2021 Goal status: INITIAL  3.  Patient will demonstrate >/= 25% improvement in lumbar AROM in all directions in order to reduce muscle tightness and improve ability to perform work related tasks Baseline: patient demonstrates gross limitation with lumbar motion in all directions (see above) Target date: 09/10/2021 Goal status: INITIAL  4.  Patient will demonstrate core/hip strength grossly >/= 4/5 MMT in order to improve lifting ability and standing/walking tolerance Baseline: core/hip  strength grossly 4-/5 MMT (see above) Target date: 09/10/2021 Goal status: INITIAL   PLAN: PT FREQUENCY: 1x/week  PT DURATION: 8 weeks  PLANNED INTERVENTIONS: Therapeutic exercises, Therapeutic activity, Neuromuscular re-education, Balance training, Gait training, Patient/Family education, Joint manipulation, Joint mobilization, Aquatic Therapy, Dry Needling, Electrical stimulation, Spinal manipulation, Spinal mobilization, Cryotherapy, Moist heat, Taping, and Manual therapy  PLAN FOR NEXT SESSION: Review HEP and progress PRN, dry needling/manual for lumbar paraspinals, continue with stretching for lumbar mobility, core and hip strength progression with focus on avoiding excessive lumbar lordosis   Rosana Hoes, PT, DPT, LAT, ATC 07/16/21  12:29 PM Phone: [phone] Fax: [phone]

## 2021-07-18 ENCOUNTER — Encounter: Payer: Self-pay | Admitting: Sports Medicine

## 2021-07-18 DIAGNOSIS — M4306 Spondylolysis, lumbar region: Secondary | ICD-10-CM

## 2021-07-18 DIAGNOSIS — M545 Low back pain, unspecified: Secondary | ICD-10-CM

## 2021-07-18 MED ORDER — CYCLOBENZAPRINE HCL 10 MG PO TABS: 10.0000 mg | ORAL_TABLET | Freq: Three times a day (TID) | ORAL | 0 refills | Status: AC | PRN

## 2021-07-18 NOTE — Telephone Encounter (Signed)
Called and spoke with patient. He said that he feels like he had an extreme flair of low back pain after his first PT visit. Pain is across low back but does not radiate down legs. Denies weakness.  ? ?Recommendations given to patient: ?- continue meloxicam 15 mg daily . Do not to use additional NSAIDs while taking meloxicam.  May use Tylenol [phone] mg 2 to 3 times a day for breakthrough pain. ?- start Flexeril 10mg  3x a day as needed for muscle spasms. New Rx sent today ? ?Follow up scheduled for 08/07/21 ?

## 2021-07-21 NOTE — Therapy (Signed)
OUTPATIENT PHYSICAL THERAPY TREATMENT NOTE   Patient Name: Mitchell [name]' Hove MRN: [id number] DOB:08/20/1985, 36 y.o., male Today's Date: 07/23/2021  PCP: Wynn Banker, MD REFERRING PROVIDER: Wynn Banker, MD   PT End of Session - 07/23/21 0913     Visit Number 2    Number of Visits 8    Date for PT Re-Evaluation 09/10/21    Authorization Type [facility]    PT Start Time 0915    PT Stop Time 1010    PT Time Calculation (min) 55 min    Activity Tolerance Patient tolerated treatment well    Behavior During Therapy WFL for tasks assessed/performed             Past Medical History:  Diagnosis Date   Appendicitis 2015   Bowel obstruction (HCC)    Glaucoma    H/O left inguinal hernia repair    Moderate alcohol use disorder in controlled environment (HCC)    Tobacco dependence    Past Surgical History:  Procedure Laterality Date   HERNIA REPAIR     I & D EXTREMITY Right 08/29/2016   Procedure: IRRIGATION AND DEBRIDEMENT OF RIGHT HAND;  Surgeon: Knute Neu, MD;  Location: [facility] OR;  Service: Plastics;  Laterality: Right;   LAPAROSCOPIC APPENDECTOMY N/A 08/13/2013   Procedure: APPENDECTOMY LAPAROSCOPIC;  Surgeon: Liz Malady, MD;  Location: [facility] OR;  Service: General;  Laterality: N/A;   LAPAROTOMY N/A 08/16/2013   Procedure: INCARCERATED ABDOMINAL WALL HERNIA REPAIR;  Surgeon: Cherylynn Ridges, MD;  Location: [facility] OR;  Service: General;  Laterality: N/A;   Patient Active Problem List   Diagnosis Date Noted   Bipolar affective disorder, mixed (HCC) 02/29/2020   Anxiety 02/29/2020   Depression, recurrent (HCC) 02/29/2020   Hyperlipidemia 08/18/2019   Reflux gastritis 08/03/2018   Hernia with obstruction 08/16/2013   S/P laparoscopic appendectomy 08/13/2013    REFERRING PROVIDER: Richardean Sale, DO   REFERRING DIAG: Acute left-sided low back pain without sciatica  THERAPY DIAG:  Chronic bilateral low back pain, unspecified whether sciatica  present  Muscle weakness (generalized)  PERTINENT HISTORY: None  PRECAUTIONS:  Lifting limited to < 15 pounds  SUBJECTIVE: Patient reports he feels better than he did last week.   PAIN:  Are you having pain? Yes NPRS scale: 3/10 Pain location: Back Pain orientation: Lower (left > right) PAIN TYPE: Chonic Pain description: Constant, tender, throbbing Aggravating factors: Sitting, standing, walking, lying down too long, bending Relieving factors: Medication  PATIENT GOALS: Pain relief and get back to work   OBJECTIVE:  PATIENT SURVEYS:  FOTO 47% functional status   MUSCLE LENGTH: Hamstring and hip flexor tightness noted bilaterally   POSTURE:  Rounded shoulder posture, increased lumbar lordosis   PALPATION: Tender to palpation left lumbar paraspinals with increased muscle tension compared to right   LUMBAR AROM   AROM AROM  07/16/2021  07/23/2021  Flexion 50% 75%  Extension 50%   Right lateral flexion 50%   Left lateral flexion 25%*   Right rotation 50%   Left rotation 25%     LE AROM/PROM: Hip PROM grossly WFL, increased pulling in left lumbar with left hip flexion   LE MMT:   MMT Right 07/16/2021 Left 07/16/2021  Core 4-  Hip flexion 4 4  Hip extension 4- 3  Hip abduction 4- 4-  Knee flexion 5 5  Knee extension 5 5   FUNCTIONAL TESTS:  Squat: decreased depth, increased forward trunk lean, bilateral toe out  GAIT: Assistive device utilized: None Level of assistance: Complete Independence Comments: decreased trunk rotation     TODAY'S TREATMENT  [facility] Adult PT Treatment:                                                DATE: 07/23/2021 Therapeutic Exercise: Recumbent bike L2 x 3 min while taking subjective LTR x 10  Hooklying SKTC 2 x 20 sec each Posterior pelvic tilt 10 x 5 sec hold Seated lumbar flexion stretch with physioball 5 x 10 sec hold Seated hamstring stretch 2 x 20 sec each Manual Therapy: Skilled palpation and monitoring of muscule  tension while performing TPDN treatment STM bilateral lumbar paraspinals Trigger Point Dry Needling Treatment: Pre-treatment instruction: Patient instructed on dry needling rationale, procedures, and possible side effects including pain during treatment (achy,cramping feeling), bruising, drop of blood, lightheadedness, nausea, sweating. Patient Consent Given: Yes Education handout provided: No Muscles treated: Left lumbar multifidus  Needle size and number: .30x2575mm x 2 Electrical stimulation performed: Yes Parameters:  Milli, 2, intensity patient tolerance Treatment response/outcome: Twitch response elicited, Palpable decrease in muscle tension, and patient reporting improved tightness of low back Post-treatment instructions: Patient instructed to expect possible mild to moderate muscle soreness later today and/or tomorrow. Patient instructed in methods to reduce muscle soreness and to continue prescribed HEP. If patient was dry needled over the lung field, patient was instructed on signs and symptoms of pneumothorax and, however unlikely, to see immediate medical attention should they occur. Patient was also educated on signs and symptoms of infection and to seek medical attention should they occur. Patient verbalized understanding of these instructions and education. MHP applied post session x 10 minutes   [facility] Adult PT Treatment:                                                DATE: 07/16/2021 Therapeutic Exercise: Posterior pelvic tilt 5 x 5 sec - hands placed under lower back for cue SLR with pelvic tilt x 5 each - hands placed under lower back for cue LTR 3 x 5 sec each Sidelying hip abduction x 10 each Child's pose stretch 2 x 20 sec   PATIENT EDUCATION:  Education details: HEP, TPDN Person educated: Patient Education method: Explanation, Demonstration, Tactile cues, Verbal cues, and Handouts Education comprehension: verbalized understanding, returned demonstration, verbal cues  required, tactile cues required, and needs further education   HOME EXERCISE PROGRAM: Access Code: [email or web address]     ASSESSMENT: CLINICAL IMPRESSION: Patient tolerated therapy well with no adverse effects. Performed TPDN treatment with e-stim attended this visit to address left lumbar pain and muscle tension. Therapy focused primarily on stretching and mobility this visit, and patient did report increase in symptoms following last visit so provided MHP for lumbar region post session to reduce residual pain or tightness following therapy. No changes made to HEP this visit. Will assess patient's response to TPDN next visit. Patient would benefit from continued skilled PT to progress mobility and strength in order to reduce pain and maximize functional ability.     OBJECTIVE IMPAIRMENTS Abnormal gait, decreased activity tolerance, decreased ROM, decreased strength, impaired flexibility, improper body mechanics, postural dysfunction, and pain.    ACTIVITY LIMITATIONS cleaning, community activity,  driving, meal prep, occupation, laundry, yard work, and shopping.    PERSONAL FACTORS Past/current experiences and Time since onset of injury/illness/exacerbation are also affecting patient's functional outcome.      GOALS: Goals reviewed with patient? Yes   SHORT TERM GOALS:   Patient will be I with initial HEP in order to progress with therapy. Baseline: provided at eval Target date: 08/13/2021 Goal status: INITIAL   2.  PT will review FOTO with patient by 3rd visit in order to understand expected progress and outcome with therapy. Baseline: assessed at eval Target date: 08/13/2021 Goal status: INITIAL   3.  Patient will report pain level </= 3/10 in order to reduce functional limitations and improve activity tolerance. Baseline: 5/10 pain at rest Target date: 08/13/2021 Goal status: INITIAL   LONG TERM GOALS:   Patient will be I with final HEP to maintain progress from PT. Baseline: provided at  eval Target date: 09/10/2021 Goal status: INITIAL   2.  Patient will report >/= 63% status on FOTO to indicate improved functional ability. Baseline: 47% functional status Target date: 09/10/2021 Goal status: INITIAL   3.  Patient will demonstrate >/= 25% improvement in lumbar AROM in all directions in order to reduce muscle tightness and improve ability to perform work related tasks Baseline: patient demonstrates gross limitation with lumbar motion in all directions (see above) Target date: 09/10/2021 Goal status: INITIAL   4.  Patient will demonstrate core/hip strength grossly >/= 4/5 MMT in order to improve lifting ability and standing/walking tolerance Baseline: core/hip strength grossly 4-/5 MMT (see above) Target date: 09/10/2021 Goal status: INITIAL     PLAN: PT FREQUENCY: 1x/week   PT DURATION: 8 weeks   PLANNED INTERVENTIONS: Therapeutic exercises, Therapeutic activity, Neuromuscular re-education, Balance training, Gait training, Patient/Family education, Joint manipulation, Joint mobilization, Aquatic Therapy, Dry Needling, Electrical stimulation, Spinal manipulation, Spinal mobilization, Cryotherapy, Moist heat, Taping, and Manual therapy   PLAN FOR NEXT SESSION: Review HEP and progress PRN, dry needling/manual for lumbar paraspinals, continue with stretching for lumbar mobility, core and hip strength progression with focus on avoiding excessive lumbar lordosis    Rosana Hoes, PT, DPT, LAT, ATC 07/23/21  10:02 AM Phone: [phone] Fax: [phone]

## 2021-07-23 ENCOUNTER — Other Ambulatory Visit: Payer: Self-pay

## 2021-07-23 ENCOUNTER — Ambulatory Visit: Payer: No Typology Code available for payment source | Admitting: Physical Therapy

## 2021-07-23 ENCOUNTER — Encounter: Payer: Self-pay | Admitting: Physical Therapy

## 2021-07-23 DIAGNOSIS — G8929 Other chronic pain: Secondary | ICD-10-CM

## 2021-07-23 DIAGNOSIS — M545 Low back pain, unspecified: Secondary | ICD-10-CM | POA: Diagnosis not present

## 2021-07-23 DIAGNOSIS — M6281 Muscle weakness (generalized): Secondary | ICD-10-CM

## 2021-07-27 NOTE — Therapy (Addendum)
?OUTPATIENT PHYSICAL THERAPY TREATMENT NOTE ? ?DISCHARGE ? ? ?Patient Name: Mitchell [name] ?MRN: [id number] ?DOB:29-Apr-1986, 36 y.o., male ?Today's Date: 07/30/2021 ? ?PCP: Caren Macadam, MD ?REFERRING PROVIDER: Caren Macadam, MD ? ? PT End of Session - 07/30/21 0915   ? ? Visit Number 3   ? Number of Visits 8   ? Date for PT Re-Evaluation 09/10/21   ? Authorization Type [facility] FOCUS   ? PT Start Time [id number]   ? PT Stop Time 0955   ? PT Time Calculation (min) 40 min   ? Activity Tolerance Patient tolerated treatment well   ? Behavior During Therapy [facility] for tasks assessed/performed   ? ?  ?  ? ?  ? ? ? ?Past Medical History:  ?Diagnosis Date  ? Appendicitis 2015  ? Bowel obstruction ([place])   ? Glaucoma   ? H/O left inguinal hernia repair   ? Moderate alcohol use disorder in controlled environment [facility])   ? Tobacco dependence   ? ?Past Surgical History:  ?Procedure Laterality Date  ? HERNIA REPAIR    ? I & D EXTREMITY Right 08/29/2016  ? Procedure: IRRIGATION AND DEBRIDEMENT OF RIGHT HAND;  Surgeon: Dayna Barker, MD;  Location: [place];  Service: Plastics;  Laterality: Right;  ? LAPAROSCOPIC APPENDECTOMY N/A 08/13/2013  ? Procedure: APPENDECTOMY LAPAROSCOPIC;  Surgeon: Zenovia Jarred, MD;  Location: [place];  Service: General;  Laterality: N/A;  ? LAPAROTOMY N/A 08/16/2013  ? Procedure: INCARCERATED ABDOMINAL WALL HERNIA REPAIR;  Surgeon: Gwenyth Ober, MD;  Location: [place];  Service: General;  Laterality: N/A;  ? ?Patient Active Problem List  ? Diagnosis Date Noted  ? Bipolar affective disorder, mixed ([place]) 02/29/2020  ? Anxiety 02/29/2020  ? Depression, recurrent ([place]) 02/29/2020  ? Hyperlipidemia 08/18/2019  ? Reflux gastritis 08/03/2018  ? Hernia with obstruction 08/16/2013  ? S/P laparoscopic appendectomy 08/13/2013  ? ? ?REFERRING PROVIDER: Glennon Mac, DO ?  ?REFERRING DIAG: Acute left-sided low back pain without sciatica ? ?THERAPY DIAG:  ?Chronic bilateral low back pain, unspecified whether  sciatica present ? ?Muscle weakness (generalized) ? ?PERTINENT HISTORY: None ? ?PRECAUTIONS:  Lifting limited to < 15 pounds ? ?SUBJECTIVE: Patient reports he laid down most of the weekend since his back was hurting. He states the dry needling last visit was not helpful, he still had pain throughout the week.  ? ?PAIN:  ?Are you having pain? Yes ?NPRS scale: 5/10 ?Pain location: Back ?Pain orientation: Lower (left > right) ?PAIN TYPE: Chonic ?Pain description: Constant, tender, throbbing ?Aggravating factors: Sitting, standing, walking, lying down too long, bending ?Relieving factors: Medication ? ?PATIENT GOALS: Pain relief and get back to work ? ? ?OBJECTIVE:  ?PATIENT SURVEYS:  ?FOTO 47% functional status ?  ?MUSCLE LENGTH: ?Hamstring and hip flexor tightness noted bilaterally - 07/30/2021 ?  ?POSTURE:  ?Rounded shoulder posture, increased lumbar lordosis ?  ?PALPATION: ?Tender to palpation left lumbar paraspinals with increased muscle tension compared to right ?  ?LUMBAR AROM ?  ?AROM AROM  ?07/16/2021  ?07/23/2021  ?07/30/2021  ?Flexion 50% 75% 75%  ?Extension 50%    ?Right lateral flexion 50%    ?Left lateral flexion 25%*    ?Right rotation 50%    ?Left rotation 25%    ?  ?LE AROM/PROM: ?Hip PROM grossly WFL, increased pulling in left lumbar with left hip flexion ?  ?LE MMT: ?  ?MMT Right ?07/16/2021 Left ?07/16/2021  ?Core 4-  ?Hip flexion 4 4  ?Hip extension  4- 3  ?Hip abduction 4- 4-  ?Knee flexion 5 5  ?Knee extension 5 5  ? ?FUNCTIONAL TESTS:  ?Squat: decreased depth, increased forward trunk lean, bilateral toe out ?  ?GAIT: ?Assistive device utilized: None ?Level of assistance: Complete Independence ?Comments: decreased trunk rotation ?  ?  ?TODAY'S TREATMENT  ?[facility] Adult PT Treatment:                                                DATE: 07/30/2021 ?Therapeutic Exercise: ?Recumbent bike L2 x 5 min while taking subjective ?LTR 5 x 5 sec each ?Piriformis stretch 2 x 15 sec each ?Supine SKTC 2 x 15 sec each ?Thomas  stretch 2 x 30 sec each ?Cat cow x 8 ?Seated hamstring stretch 2 x 15 sec each ?Posterior pelvic tilt 5 x 5 sec hold ?Bridge 2 x 5 with 5 sec hold - articulating to focus on lumbopelvic control ?SLR 2 x 5 each - focus on abdominal activation ?Modified side plank on knees 3 x 15 sec each ? ? ?[facility] Adult PT Treatment:                                                DATE: 07/23/2021 ?Therapeutic Exercise: ?Recumbent bike L2 x 3 min while taking subjective ?LTR x 10  ?Hooklying SKTC 2 x 20 sec each ?Posterior pelvic tilt 10 x 5 sec hold ?Seated lumbar flexion stretch with physioball 5 x 10 sec hold ?Seated hamstring stretch 2 x 20 sec each ?Manual Therapy: ?Skilled palpation and monitoring of muscule tension while performing TPDN treatment ?STM bilateral lumbar paraspinals ?Trigger Point Dry Needling Treatment: ?Pre-treatment instruction: Patient instructed on dry needling rationale, procedures, and possible side effects including pain during treatment (achy,cramping feeling), bruising, drop of blood, lightheadedness, nausea, sweating. ?Patient Consent Given: Yes ?Education handout provided: No ?Muscles treated: Left lumbar multifidus  ?Needle size and number: .30x60mm x 2 ?Electrical stimulation performed: Yes ?Parameters:  Milli, 2, intensity patient tolerance ?Treatment response/outcome: Twitch response elicited, Palpable decrease in muscle tension, and patient reporting improved tightness of low back ?Post-treatment instructions: Patient instructed to expect possible mild to moderate muscle soreness later today and/or tomorrow. Patient instructed in methods to reduce muscle soreness and to continue prescribed HEP. If patient was dry needled over the lung field, patient was instructed on signs and symptoms of pneumothorax and, however unlikely, to see immediate medical attention should they occur. Patient was also educated on signs and symptoms of infection and to seek medical attention should they occur. Patient  verbalized understanding of these instructions and education. ?MHP applied post session x 10 minutes ? ?[facility] Adult PT Treatment:                                                DATE: 07/16/2021 ?Therapeutic Exercise: ?Posterior pelvic tilt 5 x 5 sec - hands placed under lower back for cue ?SLR with pelvic tilt x 5 each - hands placed under lower back for cue ?LTR 3 x 5 sec each ?Sidelying hip abduction x 10 each ?Child's pose stretch 2 x 20  sec ?  ?PATIENT EDUCATION:  ?Education details: HEP ?Person educated: Patient ?Education method: Explanation, Demonstration, Tactile cues, Verbal cues, and Handouts ?Education comprehension: verbalized understanding, returned demonstration, verbal cues required, tactile cues required, and needs further education ?  ?HOME EXERCISE PROGRAM: ?Access Code: [email or web address] ?  ?  ?ASSESSMENT: ?CLINICAL IMPRESSION: ?Patient tolerated therapy well with no adverse effects. Therapy mainly focusing on mobility and stretching to reduce muscular pain and tightness , and core stabilization exercises to improve lumbopelvic control. Patient reported no benefit from [facility] last visit so held off this visit. He continues to report left low back tightness  with a moderate irritability level so exercises mainly table based. He was able to progress with core and hip strengthening exercises and required frequent cueing for core engagement and lumbar control. Updated HEP this visit. Patient would benefit from continued skilled PT to progress mobility and strength in order to reduce pain and maximize functional ability. ?  ?  ?OBJECTIVE IMPAIRMENTS Abnormal gait, decreased activity tolerance, decreased ROM, decreased strength, impaired flexibility, improper body mechanics, postural dysfunction, and pain.  ?  ?ACTIVITY LIMITATIONS cleaning, community activity, driving, meal prep, occupation, laundry, yard work, and shopping.  ?  ?PERSONAL FACTORS Past/current experiences and Time since onset of  injury/illness/exacerbation are also affecting patient's functional outcome.  ?  ?  ?GOALS: ?Goals reviewed with patient? Yes ?  ?SHORT TERM GOALS: ?  ?Patient will be I with initial HEP in order to progress with therapy. ?Baseline: p

## 2021-07-30 ENCOUNTER — Other Ambulatory Visit: Payer: Self-pay

## 2021-07-30 ENCOUNTER — Ambulatory Visit: Payer: No Typology Code available for payment source | Admitting: Physical Therapy

## 2021-07-30 ENCOUNTER — Encounter: Payer: Self-pay | Admitting: Physical Therapy

## 2021-07-30 DIAGNOSIS — M6281 Muscle weakness (generalized): Secondary | ICD-10-CM

## 2021-07-30 DIAGNOSIS — M545 Low back pain, unspecified: Secondary | ICD-10-CM | POA: Diagnosis not present

## 2021-07-30 NOTE — Patient Instructions (Signed)
Access Code: [email or web address] ?URL: [email or web address] ?Date: 07/30/2021 ?Prepared by: Rosana Hoes ? ?Exercises ?Supine Pelvic Tilt with Straight Leg Raise - 1-2 x daily - 7 x weekly - 3 sets - 5 reps ?Bridge - 1-2 x daily - 7 x weekly - 2 sets - 5 reps - 5 seconds hold ?Supine Lower Trunk Rotation - 1-2 x daily - 7 x weekly - 10 reps - 5 seconds hold ?Sidelying Hip Abduction - 1-2 x daily - 7 x weekly - 2 sets - 10 reps ?Child's Pose Stretch - 1-2 x daily - 7 x weekly - 3 reps - 20 seconds hold ?Cat Cow - 1-2 x daily - 7 x weekly - 10 reps ? ?

## 2021-08-02 NOTE — Therapy (Incomplete)
?OUTPATIENT PHYSICAL THERAPY TREATMENT NOTE ? ? ?Patient Name: Mitchell Tobar' [name] ?MRN: [id number] ?DOB:December 13, 1985, 36 y.o., male ?Today's Date: 08/02/2021 ? ?PCP: Caren Macadam, MD ?REFERRING PROVIDER: Glennon Mac, DO ? ? ? ? ? ?Past Medical History:  ?Diagnosis Date  ? Appendicitis 2015  ? Bowel obstruction ([place])   ? Glaucoma   ? H/O left inguinal hernia repair   ? Moderate alcohol use disorder in controlled environment [facility])   ? Tobacco dependence   ? ?Past Surgical History:  ?Procedure Laterality Date  ? HERNIA REPAIR    ? I & D EXTREMITY Right 08/29/2016  ? Procedure: IRRIGATION AND DEBRIDEMENT OF RIGHT HAND;  Surgeon: Dayna Barker, MD;  Location: [place];  Service: Plastics;  Laterality: Right;  ? LAPAROSCOPIC APPENDECTOMY N/A 08/13/2013  ? Procedure: APPENDECTOMY LAPAROSCOPIC;  Surgeon: Zenovia Jarred, MD;  Location: [place];  Service: General;  Laterality: N/A;  ? LAPAROTOMY N/A 08/16/2013  ? Procedure: INCARCERATED ABDOMINAL WALL HERNIA REPAIR;  Surgeon: Gwenyth Ober, MD;  Location: [place];  Service: General;  Laterality: N/A;  ? ?Patient Active Problem List  ? Diagnosis Date Noted  ? Bipolar affective disorder, mixed ([place]) 02/29/2020  ? Anxiety 02/29/2020  ? Depression, recurrent ([place]) 02/29/2020  ? Hyperlipidemia 08/18/2019  ? Reflux gastritis 08/03/2018  ? Hernia with obstruction 08/16/2013  ? S/P laparoscopic appendectomy 08/13/2013  ? ? ?REFERRING PROVIDER: Glennon Mac, DO ?  ?REFERRING DIAG: Acute left-sided low back pain without sciatica ? ?THERAPY DIAG:  ?No diagnosis found. ? ?PERTINENT HISTORY: None ? ?PRECAUTIONS:  Lifting limited to < 15 pounds ? ?SUBJECTIVE: Patient reports he laid down most of the weekend since his back was hurting. He states the dry needling last visit was not helpful, he still had pain throughout the week.  ? ?PAIN:  ?Are you having pain? Yes ?NPRS scale: 5/10 ?Pain location: Back ?Pain orientation: Lower (left > right) ?PAIN TYPE: Chonic ?Pain description:  Constant, tender, throbbing ?Aggravating factors: Sitting, standing, walking, lying down too long, bending ?Relieving factors: Medication ? ?PATIENT GOALS: Pain relief and get back to work ? ? ?OBJECTIVE:  ?PATIENT SURVEYS:  ?FOTO 47% functional status ?  ?MUSCLE LENGTH: ?Hamstring and hip flexor tightness noted bilaterally - 07/30/2021 ?  ?POSTURE:  ?Rounded shoulder posture, increased lumbar lordosis ?  ?PALPATION: ?Tender to palpation left lumbar paraspinals with increased muscle tension compared to right ?  ?LUMBAR AROM ?  ?AROM AROM  ?07/16/2021  ?07/23/2021  ?07/30/2021  ?Flexion 50% 75% 75%  ?Extension 50%    ?Right lateral flexion 50%    ?Left lateral flexion 25%*    ?Right rotation 50%    ?Left rotation 25%    ?  ?LE AROM/PROM: ?Hip PROM grossly WFL, increased pulling in left lumbar with left hip flexion ?  ?LE MMT: ?  ?MMT Right ?07/16/2021 Left ?07/16/2021  ?Core 4-  ?Hip flexion 4 4  ?Hip extension 4- 3  ?Hip abduction 4- 4-  ?Knee flexion 5 5  ?Knee extension 5 5  ? ?FUNCTIONAL TESTS:  ?Squat: decreased depth, increased forward trunk lean, bilateral toe out ?  ?GAIT: ?Assistive device utilized: None ?Level of assistance: Complete Independence ?Comments: decreased trunk rotation ?  ?  ?TODAY'S TREATMENT  ?[facility] Adult PT Treatment:  DATE: 08/06/2021 ?Therapeutic Exercise: ?Recumbent bike L2 x 5 min while taking subjective ?LTR 5 x 5 sec each ?Piriformis stretch 2 x 15 sec each ?Supine SKTC 2 x 15 sec each ?Thomas stretch 2 x 30 sec each ?Cat cow x 8 ?Seated hamstring stretch 2 x 15 sec each ?Posterior pelvic tilt 5 x 5 sec hold ?Bridge 2 x 5 with 5 sec hold - articulating to focus on lumbopelvic control ?SLR 2 x 5 each - focus on abdominal activation ?Modified side plank on knees 3 x 15 sec each ? ? ?[place] Adult PT Treatment:                                                DATE: 07/30/2021 ?Therapeutic Exercise: ?Recumbent bike L2 x 5 min while taking subjective ?LTR 5 x 5  sec each ?Piriformis stretch 2 x 15 sec each ?Supine SKTC 2 x 15 sec each ?Thomas stretch 2 x 30 sec each ?Cat cow x 8 ?Seated hamstring stretch 2 x 15 sec each ?Posterior pelvic tilt 5 x 5 sec hold ?Bridge 2 x 5 with 5 sec hold - articulating to focus on lumbopelvic control ?SLR 2 x 5 each - focus on abdominal activation ?Modified side plank on knees 3 x 15 sec each ? ?[facility] Adult PT Treatment:                                                DATE: 07/23/2021 ?Therapeutic Exercise: ?Recumbent bike L2 x 3 min while taking subjective ?LTR x 10  ?Hooklying SKTC 2 x 20 sec each ?Posterior pelvic tilt 10 x 5 sec hold ?Seated lumbar flexion stretch with physioball 5 x 10 sec hold ?Seated hamstring stretch 2 x 20 sec each ?Manual Therapy: ?Skilled palpation and monitoring of muscule tension while performing TPDN treatment ?STM bilateral lumbar paraspinals ?Trigger Point Dry Needling Treatment: ?Pre-treatment instruction: Patient instructed on dry needling rationale, procedures, and possible side effects including pain during treatment (achy,cramping feeling), bruising, drop of blood, lightheadedness, nausea, sweating. ?Patient Consent Given: Yes ?Education handout provided: No ?Muscles treated: Left lumbar multifidus  ?Needle size and number: .30x59mm x 2 ?Electrical stimulation performed: Yes ?Parameters:  Milli, 2, intensity patient tolerance ?Treatment response/outcome: Twitch response elicited, Palpable decrease in muscle tension, and patient reporting improved tightness of low back ?Post-treatment instructions: Patient instructed to expect possible mild to moderate muscle soreness later today and/or tomorrow. Patient instructed in methods to reduce muscle soreness and to continue prescribed HEP. If patient was dry needled over the lung field, patient was instructed on signs and symptoms of pneumothorax and, however unlikely, to see immediate medical attention should they occur. Patient was also educated on signs and  symptoms of infection and to seek medical attention should they occur. Patient verbalized understanding of these instructions and education. ?MHP applied post session x 10 minutes ?  ?PATIENT EDUCATION:  ?Education details: HEP ?Person educated: Patient ?Education method: Explanation, Demonstration, Tactile cues, Verbal cues, and Handouts ?Education comprehension: verbalized understanding, returned demonstration, verbal cues required, tactile cues required, and needs further education ?  ?HOME EXERCISE PROGRAM: ?Access Code: [email or web address] ?  ?  ?ASSESSMENT: ?CLINICAL IMPRESSION: ?Patient tolerated therapy well with no adverse effects. *** Patient would  benefit from continued skilled PT to progress mobility and strength in order to reduce pain and maximize functional ability. ? ?Therapy mainly focusing on mobility and stretching to reduce muscular pain and tightness , and core stabilization exercises to improve lumbopelvic control. Patient reported no benefit from [facility] last visit so held off this visit. He continues to report left low back tightness  with a moderate irritability level so exercises mainly table based. He was able to progress with core and hip strengthening exercises and required frequent cueing for core engagement and lumbar control. Updated HEP this visit.  ?  ?  ?OBJECTIVE IMPAIRMENTS Abnormal gait, decreased activity tolerance, decreased ROM, decreased strength, impaired flexibility, improper body mechanics, postural dysfunction, and pain.  ?  ?ACTIVITY LIMITATIONS cleaning, community activity, driving, meal prep, occupation, laundry, yard work, and shopping.  ?  ?PERSONAL FACTORS Past/current experiences and Time since onset of injury/illness/exacerbation are also affecting patient's functional outcome.  ?  ?  ?GOALS: ?Goals reviewed with patient? Yes ?  ?SHORT TERM GOALS: ?  ?Patient will be I with initial HEP in order to progress with therapy. ?Baseline: provided at eval ?Target date: 08/13/2021 ?Goal  status: INITIAL ?  ?2.  PT will review FOTO with patient by 3rd visit in order to understand expected progress and outcome with therapy. ?Baseline: assessed at eval ?Target date: 08/13/2021 ?Goal status: INITI

## 2021-08-06 ENCOUNTER — Ambulatory Visit: Payer: No Typology Code available for payment source | Admitting: Physical Therapy

## 2021-08-06 ENCOUNTER — Other Ambulatory Visit: Payer: Self-pay | Admitting: Sports Medicine
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
## 2021-08-07 ENCOUNTER — Ambulatory Visit: Payer: No Typology Code available for payment source | Admitting: Sports Medicine

## 2021-08-08 NOTE — Therapy (Incomplete)
?OUTPATIENT PHYSICAL THERAPY TREATMENT NOTE ? ? ?Patient Name: Mitchell [name] ?MRN: [id number] ?DOB:02-22-1986, 36 y.o., male ?Today's Date: 08/08/2021 ? ?PCP: Wynn Banker, MD ?REFERRING PROVIDER: Wynn Banker, MD ? ? ? ? ? ?Past Medical History:  ?Diagnosis Date  ? Appendicitis 2015  ? Bowel obstruction (HCC)   ? Glaucoma   ? H/O left inguinal hernia repair   ? Moderate alcohol use disorder in controlled environment [facility])   ? Tobacco dependence   ? ?Past Surgical History:  ?Procedure Laterality Date  ? HERNIA REPAIR    ? I & D EXTREMITY Right 08/29/2016  ? Procedure: IRRIGATION AND DEBRIDEMENT OF RIGHT HAND;  Surgeon: Knute Neu, MD;  Location: [facility] OR;  Service: Plastics;  Laterality: Right;  ? LAPAROSCOPIC APPENDECTOMY N/A 08/13/2013  ? Procedure: APPENDECTOMY LAPAROSCOPIC;  Surgeon: Liz Malady, MD;  Location: [facility] OR;  Service: General;  Laterality: N/A;  ? LAPAROTOMY N/A 08/16/2013  ? Procedure: INCARCERATED ABDOMINAL WALL HERNIA REPAIR;  Surgeon: Cherylynn Ridges, MD;  Location: [facility] OR;  Service: General;  Laterality: N/A;  ? ?Patient Active Problem List  ? Diagnosis Date Noted  ? Bipolar affective disorder, mixed (HCC) 02/29/2020  ? Anxiety 02/29/2020  ? Depression, recurrent (HCC) 02/29/2020  ? Hyperlipidemia 08/18/2019  ? Reflux gastritis 08/03/2018  ? Hernia with obstruction 08/16/2013  ? S/P laparoscopic appendectomy 08/13/2013  ? ? ?REFERRING PROVIDER: Richardean Sale, DO ?  ?REFERRING DIAG: Acute left-sided low back pain without sciatica ? ?THERAPY DIAG:  ?No diagnosis found. ? ?PERTINENT HISTORY: None ? ?PRECAUTIONS:  Lifting limited to < 15 pounds ? ?SUBJECTIVE: Patient reports he laid down most of the weekend since his back was hurting. He states the dry needling last visit was not helpful, he still had pain throughout the week.  ? ?PAIN:  ?Are you having pain? Yes ?NPRS scale: 5/10 ?Pain location: Back ?Pain orientation: Lower (left > right) ?PAIN TYPE: Chonic ?Pain description:  Constant, tender, throbbing ?Aggravating factors: Sitting, standing, walking, lying down too long, bending ?Relieving factors: Medication ? ?PATIENT GOALS: Pain relief and get back to work ? ? ?OBJECTIVE:  ?PATIENT SURVEYS:  ?FOTO 47% functional status ?  ?MUSCLE LENGTH: ?Hamstring and hip flexor tightness noted bilaterally - 07/30/2021 ?  ?POSTURE:  ?Rounded shoulder posture, increased lumbar lordosis ?  ?PALPATION: ?Tender to palpation left lumbar paraspinals with increased muscle tension compared to right ?  ?LUMBAR AROM ?  ?AROM AROM  ?07/16/2021  ?07/23/2021  ?07/30/2021  ?Flexion 50% 75% 75%  ?Extension 50%    ?Right lateral flexion 50%    ?Left lateral flexion 25%*    ?Right rotation 50%    ?Left rotation 25%    ?  ?LE AROM/PROM: ?Hip PROM grossly WFL, increased pulling in left lumbar with left hip flexion ?  ?LE MMT: ?  ?MMT Right ?07/16/2021 Left ?07/16/2021  ?Core 4-  ?Hip flexion 4 4  ?Hip extension 4- 3  ?Hip abduction 4- 4-  ?Knee flexion 5 5  ?Knee extension 5 5  ? ?FUNCTIONAL TESTS:  ?Squat: decreased depth, increased forward trunk lean, bilateral toe out ?  ?GAIT: ?Assistive device utilized: None ?Level of assistance: Complete Independence ?Comments: decreased trunk rotation ?  ?  ?TODAY'S TREATMENT  ?[facility] Adult PT Treatment:  DATE: 08/13/2021 ?Therapeutic Exercise: ?Recumbent bike L2 x 5 min while taking subjective ?LTR 5 x 5 sec each ?Piriformis stretch 2 x 15 sec each ?Supine SKTC 2 x 15 sec each ?Thomas stretch 2 x 30 sec each ?Cat cow x 8 ?Seated hamstring stretch 2 x 15 sec each ?Posterior pelvic tilt 5 x 5 sec hold ?Bridge 2 x 5 with 5 sec hold - articulating to focus on lumbopelvic control ?SLR 2 x 5 each - focus on abdominal activation ?Modified side plank on knees 3 x 15 sec each ? ? ?[facility] Adult PT Treatment:                                                DATE: 07/30/2021 ?Therapeutic Exercise: ?Recumbent bike L2 x 5 min while taking subjective ?LTR 5 x 5  sec each ?Piriformis stretch 2 x 15 sec each ?Supine SKTC 2 x 15 sec each ?Thomas stretch 2 x 30 sec each ?Cat cow x 8 ?Seated hamstring stretch 2 x 15 sec each ?Posterior pelvic tilt 5 x 5 sec hold ?Bridge 2 x 5 with 5 sec hold - articulating to focus on lumbopelvic control ?SLR 2 x 5 each - focus on abdominal activation ?Modified side plank on knees 3 x 15 sec each ? ?[facility] Adult PT Treatment:                                                DATE: 07/23/2021 ?Therapeutic Exercise: ?Recumbent bike L2 x 3 min while taking subjective ?LTR x 10  ?Hooklying SKTC 2 x 20 sec each ?Posterior pelvic tilt 10 x 5 sec hold ?Seated lumbar flexion stretch with physioball 5 x 10 sec hold ?Seated hamstring stretch 2 x 20 sec each ?Manual Therapy: ?Skilled palpation and monitoring of muscule tension while performing TPDN treatment ?STM bilateral lumbar paraspinals ?Trigger Point Dry Needling Treatment: ?Pre-treatment instruction: Patient instructed on dry needling rationale, procedures, and possible side effects including pain during treatment (achy,cramping feeling), bruising, drop of blood, lightheadedness, nausea, sweating. ?Patient Consent Given: Yes ?Education handout provided: No ?Muscles treated: Left lumbar multifidus  ?Needle size and number: .30x81mm x 2 ?Electrical stimulation performed: Yes ?Parameters:  Milli, 2, intensity patient tolerance ?Treatment response/outcome: Twitch response elicited, Palpable decrease in muscle tension, and patient reporting improved tightness of low back ?Post-treatment instructions: Patient instructed to expect possible mild to moderate muscle soreness later today and/or tomorrow. Patient instructed in methods to reduce muscle soreness and to continue prescribed HEP. If patient was dry needled over the lung field, patient was instructed on signs and symptoms of pneumothorax and, however unlikely, to see immediate medical attention should they occur. Patient was also educated on signs and  symptoms of infection and to seek medical attention should they occur. Patient verbalized understanding of these instructions and education. ?MHP applied post session x 10 minutes ?  ?PATIENT EDUCATION:  ?Education details: HEP ?Person educated: Patient ?Education method: Explanation, Demonstration, Tactile cues, Verbal cues, and Handouts ?Education comprehension: verbalized understanding, returned demonstration, verbal cues required, tactile cues required, and needs further education ?  ?HOME EXERCISE PROGRAM: ?Access Code: [email or web address] ?  ?  ?ASSESSMENT: ?CLINICAL IMPRESSION: ?Patient tolerated therapy well with no adverse effects. *** Patient would  benefit from continued skilled PT to progress mobility and strength in order to reduce pain and maximize functional ability. ? ?Therapy mainly focusing on mobility and stretching to reduce muscular pain and tightness , and core stabilization exercises to improve lumbopelvic control. [facility] so held off this visit. He continues to report left low back tightness  with a moderate irritability level so exercises mainly table based. He was able to progress with core and hip strengthening exercises and required frequent cueing for core engagement and lumbar control. Updated HEP this visit.  ?  ?  ?OBJECTIVE IMPAIRMENTS Abnormal gait, decreased activity tolerance, decreased ROM, decreased strength, impaired flexibility, improper body mechanics, postural dysfunction, and pain.  ?  ?ACTIVITY LIMITATIONS cleaning, community activity, driving, meal prep, occupation, laundry, yard work, and shopping.  ?  ?PERSONAL FACTORS Past/current experiences and Time since onset of injury/illness/exacerbation are also affecting patient's functional outcome.  ?  ?  ?GOALS: ?Goals reviewed with patient? Yes ?  ?SHORT TERM GOALS: ?  ?Patient will be I with initial HEP in order to progress with therapy. ?Baseline: provided at eval ?Target date: 08/13/2021 ?Goal  status: INITIAL ?  ?2.  PT will review FOTO with patient by 3rd visit in order to understand expected progress and outcome with therapy. ?Baseline: assessed at eval ?Target date: 08/13/2021 ?Goal status: INIT
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
## 2021-08-13 ENCOUNTER — Ambulatory Visit: Payer: No Typology Code available for payment source | Admitting: Sports Medicine

## 2021-08-13 ENCOUNTER — Ambulatory Visit: Payer: No Typology Code available for payment source | Attending: Sports Medicine | Admitting: Physical Therapy

## 2021-08-14 ENCOUNTER — Telehealth: Payer: Self-pay | Admitting: Physical Therapy

## 2021-08-14 NOTE — Telephone Encounter (Signed)
Attempted to contact patient due to missed PT appointment. No answer and unable to leave message. ? ?Hilda Blades, PT, DPT, LAT, ATC ?08/14/21  3:02 PM ?Phone: [phone] ?Fax: [phone] ? ?

## 2021-10-18 ENCOUNTER — Other Ambulatory Visit: Payer: Self-pay | Admitting: Family Medicine

## 2021-10-20 MED ORDER — QUETIAPINE FUMARATE 100 MG PO TABS
100.0000 mg | ORAL_TABLET | Freq: Every day | ORAL | 1 refills | Status: DC
  Filled 2021-10-20: qty 90, 90d supply, fill #0

## 2021-10-22 ENCOUNTER — Other Ambulatory Visit (HOSPITAL_COMMUNITY): Payer: Self-pay

## 2021-10-30 ENCOUNTER — Ambulatory Visit (INDEPENDENT_AMBULATORY_CARE_PROVIDER_SITE_OTHER): Payer: No Typology Code available for payment source | Admitting: Sports Medicine

## 2021-10-30 VITALS — BP 138/76 | HR 102 | Ht 70.0 in | Wt 228.0 lb

## 2021-10-30 DIAGNOSIS — M4306 Spondylolysis, lumbar region: Secondary | ICD-10-CM | POA: Diagnosis not present

## 2021-10-30 DIAGNOSIS — M5442 Lumbago with sciatica, left side: Secondary | ICD-10-CM

## 2021-10-30 DIAGNOSIS — G8929 Other chronic pain: Secondary | ICD-10-CM

## 2021-10-30 MED ORDER — MELOXICAM 15 MG PO TABS
15.0000 mg | ORAL_TABLET | Freq: Every day | ORAL | 0 refills | Status: DC
  Filled 2021-10-31: qty 30, 30d supply, fill #0

## 2021-10-30 MED ORDER — CYCLOBENZAPRINE HCL 5 MG PO TABS: 5.0000 mg | ORAL_TABLET | Freq: Every day | ORAL | 0 refills | Status: DC
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
## 2021-10-30 NOTE — Patient Instructions (Addendum)
Good to see you  MRI referral lumbar  - Start meloxicam 15 mg daily x2 weeks.  If still having pain after 2 weeks, complete 3rd-week of meloxicam. May use remaining meloxicam as needed once daily for pain control.  Do not to use additional NSAIDs while taking meloxicam.  May use Tylenol [phone] mg 2 to 3 times a day for breakthrough pain. Flexeril 5 mg nightly as needed for muscle spasm  Call us to follow up 3 days after your MRI to discuss your results

## 2021-10-31 ENCOUNTER — Other Ambulatory Visit (HOSPITAL_COMMUNITY): Payer: Self-pay

## 2021-11-04 ENCOUNTER — Ambulatory Visit (INDEPENDENT_AMBULATORY_CARE_PROVIDER_SITE_OTHER): Payer: No Typology Code available for payment source

## 2021-11-04 DIAGNOSIS — M4306 Spondylolysis, lumbar region: Secondary | ICD-10-CM

## 2021-11-04 DIAGNOSIS — G8929 Other chronic pain: Secondary | ICD-10-CM | POA: Diagnosis not present

## 2021-11-04 DIAGNOSIS — M5442 Lumbago with sciatica, left side: Secondary | ICD-10-CM | POA: Diagnosis not present
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
## 2021-11-15 ENCOUNTER — Other Ambulatory Visit (HOSPITAL_COMMUNITY): Payer: Self-pay

## 2021-11-15 ENCOUNTER — Ambulatory Visit (INDEPENDENT_AMBULATORY_CARE_PROVIDER_SITE_OTHER): Payer: No Typology Code available for payment source | Admitting: Sports Medicine

## 2021-11-15 VITALS — BP 168/112 | HR 92 | Ht 70.0 in | Wt 238.0 lb

## 2021-11-15 DIAGNOSIS — M5442 Lumbago with sciatica, left side: Secondary | ICD-10-CM | POA: Diagnosis not present

## 2021-11-15 DIAGNOSIS — M4306 Spondylolysis, lumbar region: Secondary | ICD-10-CM | POA: Diagnosis not present

## 2021-11-15 DIAGNOSIS — G8929 Other chronic pain: Secondary | ICD-10-CM | POA: Diagnosis not present

## 2021-11-15 MED ORDER — GABAPENTIN 100 MG PO CAPS
100.0000 mg | ORAL_CAPSULE | Freq: Every day | ORAL | 0 refills | Status: DC
  Filled 2021-11-15: qty 30, 30d supply, fill #0

## 2021-11-15 NOTE — Patient Instructions (Addendum)
Thank you for coming in today.   Discontinue the Flexeril and Meloxicam  Please call [facility] at [phone] to schedule your spine injection.    I've sent a prescription for Gabapentin to your pharmacy.   I've referred you to Physical Therapy.  Let us know if you don't hear from them in one week.   Call the office and schedule a follow up visit 2 weeks after the epidural

## 2021-11-20 ENCOUNTER — Ambulatory Visit
Admission: RE | Admit: 2021-11-20 | Discharge: 2021-11-20 | Disposition: A | Discharge status: Home / Self Care | Payer: No Typology Code available for payment source | Source: Ambulatory Visit | Attending: Sports Medicine | Admitting: Sports Medicine

## 2021-11-20 DIAGNOSIS — M4306 Spondylolysis, lumbar region: Secondary | ICD-10-CM

## 2021-11-20 DIAGNOSIS — G8929 Other chronic pain: Secondary | ICD-10-CM

## 2021-11-20 MED ORDER — IOPAMIDOL (ISOVUE-M 200) INJECTION 41%
1.0000 mL | Freq: Once | INTRAMUSCULAR | Status: AC
  Administered 2021-11-20: 1 mL via EPIDURAL

## 2021-11-20 MED ORDER — METHYLPREDNISOLONE ACETATE 40 MG/ML INJ SUSP (RADIOLOG
80.0000 mg | Freq: Once | INTRAMUSCULAR | Status: AC
  Administered 2021-11-20: 80 mg via EPIDURAL

## 2021-11-20 NOTE — Discharge Instructions (Signed)

## 2021-12-06 ENCOUNTER — Ambulatory Visit: Payer: No Typology Code available for payment source | Attending: Sports Medicine | Admitting: Physical Therapy

## 2022-02-15 ENCOUNTER — Encounter: Payer: No Typology Code available for payment source | Admitting: Family Medicine

## 2022-06-25 ENCOUNTER — Telehealth: Payer: 59 | Admitting: Physician Assistant

## 2022-06-25 ENCOUNTER — Other Ambulatory Visit (HOSPITAL_COMMUNITY): Payer: Self-pay

## 2022-06-25 DIAGNOSIS — J069 Acute upper respiratory infection, unspecified: Secondary | ICD-10-CM

## 2022-06-25 MED ORDER — BENZONATATE 100 MG PO CAPS
100.0000 mg | ORAL_CAPSULE | Freq: Three times a day (TID) | ORAL | 0 refills | Status: DC | PRN
  Filled 2022-06-25: qty 30, 10d supply, fill #0

## 2022-06-25 MED ORDER — IPRATROPIUM BROMIDE 0.03 % NA SOLN
2.0000 | Freq: Two times a day (BID) | NASAL | 0 refills | Status: DC
  Filled 2022-06-25: qty 30, 30d supply, fill #0

## 2022-06-25 NOTE — Progress Notes (Signed)

## 2022-06-25 NOTE — Progress Notes (Signed)
I have spent 5 minutes in review of e-visit questionnaire, review and updating patient chart, medical decision making and response to patient.   Renesmay Nesbitt Cody Leandrew Keech, PA-C    
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
## 2022-07-15 ENCOUNTER — Ambulatory Visit: Payer: 59 | Admitting: Sports Medicine

## 2022-07-15 VITALS — BP 132/82 | HR 110 | Ht 70.0 in | Wt 233.0 lb

## 2022-07-15 DIAGNOSIS — M4306 Spondylolysis, lumbar region: Secondary | ICD-10-CM

## 2022-07-15 DIAGNOSIS — M5442 Lumbago with sciatica, left side: Secondary | ICD-10-CM | POA: Diagnosis not present

## 2022-07-15 DIAGNOSIS — G8929 Other chronic pain: Secondary | ICD-10-CM | POA: Diagnosis not present

## 2022-07-15 NOTE — Patient Instructions (Addendum)
Good to see you Epidural referral left L5-S1 Follow up 2 weeks after to discuss results  Work note lifting no more than 15 pound 2 weeks

## 2022-07-17 ENCOUNTER — Ambulatory Visit
Admission: RE | Admit: 2022-07-17 | Discharge: 2022-07-17 | Disposition: A | Discharge status: Home / Self Care | Payer: 59 | Source: Ambulatory Visit | Attending: Sports Medicine | Admitting: Sports Medicine

## 2022-07-17 DIAGNOSIS — G8929 Other chronic pain: Secondary | ICD-10-CM

## 2022-07-17 DIAGNOSIS — M47816 Spondylosis without myelopathy or radiculopathy, lumbar region: Secondary | ICD-10-CM | POA: Diagnosis not present

## 2022-07-17 DIAGNOSIS — M4306 Spondylolysis, lumbar region: Secondary | ICD-10-CM

## 2022-07-17 MED ORDER — METHYLPREDNISOLONE ACETATE 40 MG/ML INJ SUSP (RADIOLOG
80.0000 mg | Freq: Once | INTRAMUSCULAR | Status: AC
  Administered 2022-07-17: 80 mg via EPIDURAL

## 2022-07-17 MED ORDER — IOPAMIDOL (ISOVUE-M 200) INJECTION 41%
1.0000 mL | Freq: Once | INTRAMUSCULAR | Status: AC
  Administered 2022-07-17: 1 mL via EPIDURAL

## 2022-07-17 NOTE — Discharge Instructions (Signed)
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
## 2022-07-31 ENCOUNTER — Other Ambulatory Visit (HOSPITAL_COMMUNITY): Payer: Self-pay

## 2022-07-31 ENCOUNTER — Ambulatory Visit (INDEPENDENT_AMBULATORY_CARE_PROVIDER_SITE_OTHER): Payer: 59 | Admitting: Sports Medicine

## 2022-07-31 VITALS — BP 138/88 | HR 103 | Ht 70.0 in | Wt 232.0 lb

## 2022-07-31 DIAGNOSIS — M5442 Lumbago with sciatica, left side: Secondary | ICD-10-CM

## 2022-07-31 DIAGNOSIS — M4306 Spondylolysis, lumbar region: Secondary | ICD-10-CM

## 2022-07-31 DIAGNOSIS — G8929 Other chronic pain: Secondary | ICD-10-CM

## 2022-07-31 MED ORDER — MELOXICAM 15 MG PO TABS
15.0000 mg | ORAL_TABLET | Freq: Every day | ORAL | 0 refills | Status: DC
  Filled 2022-07-31: qty 30, 30d supply, fill #0

## 2022-07-31 NOTE — Patient Instructions (Addendum)
Good to see you  - Start meloxicam 15 mg as needed recommend no more than 1-2 times per week Tylenol [phone] mg 2-3 times a day for pain relief  Neurosurgery referral  Call and make an appointment with [place] after your visit with neurosurgery  Work note provided no lifting more than 15 pounds 4 weeks

## 2022-08-16 DIAGNOSIS — Z6832 Body mass index (BMI) 32.0-32.9, adult: Secondary | ICD-10-CM | POA: Diagnosis not present

## 2022-08-16 DIAGNOSIS — M4306 Spondylolysis, lumbar region: Secondary | ICD-10-CM | POA: Diagnosis not present

## 2022-09-03 ENCOUNTER — Telehealth: Payer: 59 | Admitting: Family Medicine

## 2022-09-03 ENCOUNTER — Encounter: Payer: Self-pay | Admitting: Gastroenterology

## 2022-09-03 DIAGNOSIS — K920 Hematemesis: Secondary | ICD-10-CM

## 2022-09-03 NOTE — Progress Notes (Signed)
Based on what you shared with me, I feel your condition warrants further evaluation as soon as possible at an Emergency department.   Vomiting that is red or maroon of dark in color needs to have an inperson evaluation.    NOTE: There will be NO CHARGE for this eVisit

## 2022-10-10 ENCOUNTER — Encounter: Payer: 59 | Admitting: Family Medicine

## 2022-11-05 DIAGNOSIS — H40013 Open angle with borderline findings, low risk, bilateral: Secondary | ICD-10-CM | POA: Diagnosis not present

## 2022-11-06 ENCOUNTER — Ambulatory Visit: Payer: 59 | Admitting: Gastroenterology

## 2023-01-16 ENCOUNTER — Ambulatory Visit: Payer: 59 | Admitting: Gastroenterology

## 2023-04-09 ENCOUNTER — Encounter: Payer: Self-pay | Admitting: Family Medicine

## 2023-04-09 ENCOUNTER — Ambulatory Visit: Payer: 59 | Admitting: Family Medicine

## 2023-04-09 ENCOUNTER — Other Ambulatory Visit (HOSPITAL_COMMUNITY): Payer: Self-pay

## 2023-04-09 VITALS — BP 140/100 | HR 95 | Temp 99.5°F | Ht 70.0 in | Wt 236.9 lb

## 2023-04-09 DIAGNOSIS — F3160 Bipolar disorder, current episode mixed, unspecified: Secondary | ICD-10-CM

## 2023-04-09 DIAGNOSIS — R030 Elevated blood-pressure reading, without diagnosis of hypertension: Secondary | ICD-10-CM | POA: Diagnosis not present

## 2023-04-09 DIAGNOSIS — E785 Hyperlipidemia, unspecified: Secondary | ICD-10-CM

## 2023-04-09 DIAGNOSIS — M545 Low back pain, unspecified: Secondary | ICD-10-CM | POA: Diagnosis not present

## 2023-04-09 DIAGNOSIS — R1084 Generalized abdominal pain: Secondary | ICD-10-CM | POA: Diagnosis not present

## 2023-04-09 LAB — COMPREHENSIVE METABOLIC PANEL
ALT: 19 U/L (ref 0–53)
AST: 25 U/L (ref 0–37)
Albumin: 4.8 g/dL (ref 3.5–5.2)
Alkaline Phosphatase: 75 U/L (ref 39–117)
BUN: 17 mg/dL (ref 6–23)
CO2: 28 meq/L (ref 19–32)
Calcium: 9.9 mg/dL (ref 8.4–10.5)
Chloride: 103 meq/L (ref 96–112)
Creatinine, Ser: 1.05 mg/dL (ref 0.40–1.50)
GFR: 89.92 mL/min (ref >60.00)
Glucose, Bld: 105 mg/dL — ABNORMAL HIGH (ref 70–99)
Potassium: 3.9 meq/L (ref 3.5–5.1)
Sodium: 140 meq/L (ref 135–145)
Total Bilirubin: 0.8 mg/dL (ref 0.2–1.2)
Total Protein: 7.2 g/dL (ref 6.0–8.3)

## 2023-04-09 LAB — LIPID PANEL
Cholesterol: 171 mg/dL (ref 0–200)
HDL: 37.1 mg/dL — ABNORMAL LOW (ref >39.00)
LDL Cholesterol: 97 mg/dL (ref 0–99)
NonHDL: 133.49
Total CHOL/HDL Ratio: 5
Triglycerides: 180 mg/dL — ABNORMAL HIGH (ref 0.0–149.0)
VLDL: 36 mg/dL (ref 0.0–40.0)

## 2023-04-09 LAB — TSH: TSH: 1.09 u[IU]/mL (ref 0.35–5.50)

## 2023-04-09 MED ORDER — HYDROXYZINE PAMOATE 25 MG PO CAPS
25.0000 mg | ORAL_CAPSULE | Freq: Three times a day (TID) | ORAL | 2 refills | Status: AC | PRN
  Filled 2023-04-09: qty 30, 10d supply, fill #0
  Filled 2023-09-24: qty 30, 10d supply, fill #1

## 2023-04-09 MED ORDER — ARIPIPRAZOLE 5 MG PO TABS
5.0000 mg | ORAL_TABLET | Freq: Every day | ORAL | 2 refills | Status: AC
  Filled 2023-04-09: qty 30, 30d supply, fill #0
  Filled 2023-09-24: qty 30, 30d supply, fill #1

## 2023-04-09 MED ORDER — MELOXICAM 15 MG PO TABS
15.0000 mg | ORAL_TABLET | Freq: Every day | ORAL | 1 refills | Status: AC
  Filled 2023-04-09: qty 90, 90d supply, fill #0
  Filled 2023-09-24: qty 90, 90d supply, fill #1

## 2023-04-09 NOTE — Patient Instructions (Signed)
OK to take tylenol with meloxicam if needed -- do not take ibuprofen or naproxen with meloxicam

## 2023-04-09 NOTE — Progress Notes (Signed)
Established Patient Office Visit  Subjective   Patient ID: Mitchell [name], male    DOB: 01-02-1986  Age: 37 y.o. MRN: [id number]  Chief Complaint  Patient presents with   Establish Care    Patient is here for [facility] visit today. Last saw Dr. Hassan Rowan in 06/2021. He reports he has a long history of chronic back pain, sees sports medicine specialist for this. States that his pain is relatively well controlled. States that he was taking meloxicam daily for his pain, would like refills of this medication.  Pt states that he had surgery on his stomach years ago, states that he had an appendectomy and had a blockage in his intestine. States that now when he eats something he immediately has to go to the bathroom and have a BM. Doesn't happen every time but does happen often. States that he gets sharp pains in his stomach a lot. No nausea or vomiting, no constipation issues. Would like a referral to GI to discuss these problems.   Bipolar disorder-- pt reports he is having increasing amounts of anxiety and difficulty controlling his mood. He reports labile affect, difficulty controlling irriability and difficulty with sleep.       Current Outpatient Medications  Medication Instructions   ARIPiprazole (ABILIFY) 5 mg, Oral, Daily at bedtime   hydrOXYzine (VISTARIL) 25 mg, Oral, Every 8 hours PRN   meloxicam (MOBIC) 15 mg, Oral, Daily    Patient Active Problem List   Diagnosis Date Noted   Left-sided low back pain without sciatica 04/13/2023   Elevated blood pressure reading 04/13/2023   Bipolar affective disorder, mixed (HCC) 02/29/2020   Anxiety 02/29/2020   Depression, recurrent (HCC) 02/29/2020   Hyperlipidemia 08/18/2019   Reflux gastritis 08/03/2018   Hernia with obstruction 08/16/2013   S/P laparoscopic appendectomy 08/13/2013      Review of Systems  All other systems reviewed and are negative.     Objective:     BP (!) 140/100 (BP Location: Left Arm, Patient  Position: Sitting, Cuff Size: Large)   Pulse 95   Temp 99.5 F (37.5 C) (Oral)   Ht 5\' 10"  (1.778 m)   Wt 236 lb 14.4 oz (107.5 kg)   SpO2 98%   BMI 33.99 kg/m    Physical Exam Vitals reviewed.  Constitutional:      Appearance: Normal appearance. He is well-groomed and normal weight.  Eyes:     Extraocular Movements: Extraocular movements intact.     Conjunctiva/sclera: Conjunctivae normal.  Neck:     Thyroid: No thyromegaly.  Cardiovascular:     Rate and Rhythm: Normal rate and regular rhythm.     Heart sounds: S1 normal and S2 normal. No murmur heard. Pulmonary:     Effort: Pulmonary effort is normal.     Breath sounds: Normal breath sounds and air entry. No rales.  Abdominal:     General: Abdomen is flat. Bowel sounds are normal.  Musculoskeletal:     Right lower leg: No edema.     Left lower leg: No edema.  Neurological:     General: No focal deficit present.     Mental Status: He is alert and oriented to person, place, and time.     Gait: Gait is intact.  Psychiatric:        Mood and Affect: Mood and affect normal.      The ASCVD Risk score (Arnett DK, et al., 2019) failed to calculate for the following reasons:   The 2019 ASCVD  risk score is only valid for ages 47 to 31    Assessment & Plan:  Left-sided low back pain without sciatica, unspecified chronicity Assessment & Plan: Chronic, recurrent, will add meloxicam 15 mg daily to help with his pain level  Orders: -     Meloxicam; Take 1 tablet (15 mg total) by mouth daily.  Dispense: 90 tablet; Refill: 1  Generalized abdominal pain -     Ambulatory referral to Gastroenterology  Hyperlipidemia, unspecified hyperlipidemia type Assessment & Plan: Not currently on medication, needs new lipid panel  Orders: -     Lipid panel; Future  Elevated blood pressure reading Assessment & Plan: No previous diagnosis, will check labs and I recommended he watch his blood pressure at home. I will see him back short  term and recheck his BP at the next visit.  If it remains elevated then we will consider medication  Orders: -     Comprehensive metabolic panel; Future -     TSH; Future  Bipolar affective disorder, mixed (HCC) Assessment & Plan: Pt not currently on medication but he reports high levels of anxiety and difficulty controlling his mood. I recommended starting abilify 5 mg daily to help improve mood stabilization. I explained the risks/benefits of the medication and encouraged him to try it.   Orders: -     ARIPiprazole; Take 1 tablet (5 mg total) by mouth at bedtime.  Dispense: 30 tablet; Refill: 2 -     hydrOXYzine Pamoate; Take 1 capsule (25 mg total) by mouth every 8 (eight) hours as needed for anxiety.  Dispense: 30 capsule; Refill: 2     Return in about 3 months (around 07/10/2023) for annual physical exam.    Karie Georges, MD

## 2023-04-13 DIAGNOSIS — R030 Elevated blood-pressure reading, without diagnosis of hypertension: Secondary | ICD-10-CM | POA: Insufficient documentation

## 2023-04-13 DIAGNOSIS — M545 Low back pain, unspecified: Secondary | ICD-10-CM | POA: Insufficient documentation

## 2023-04-13 NOTE — Assessment & Plan Note (Signed)
No previous diagnosis, will check labs and I recommended he watch his blood pressure at home. I will see him back short term and recheck his BP at the next visit.  If it remains elevated then we will consider medication

## 2023-04-13 NOTE — Assessment & Plan Note (Signed)
Pt not currently on medication but he reports high levels of anxiety and difficulty controlling his mood. I recommended starting abilify 5 mg daily to help improve mood stabilization. I explained the risks/benefits of the medication and encouraged him to try it.

## 2023-04-13 NOTE — Assessment & Plan Note (Signed)
Not currently on medication, needs new lipid panel

## 2023-04-13 NOTE — Assessment & Plan Note (Signed)
Chronic, recurrent, will add meloxicam 15 mg daily to help with his pain level

## 2023-05-10 ENCOUNTER — Telehealth: Payer: 59 | Admitting: Nurse Practitioner

## 2023-05-10 DIAGNOSIS — H109 Unspecified conjunctivitis: Secondary | ICD-10-CM

## 2023-05-10 MED ORDER — POLYMYXIN B-TRIMETHOPRIM 10000-0.1 UNIT/ML-% OP SOLN: 1.0000 [drp] | OPHTHALMIC | 0 refills | Status: DC

## 2023-05-10 NOTE — Progress Notes (Signed)
E-Visit for Pink Eye ? ? ?We are sorry that you are not feeling well.  Here is how we plan to help! ? ?Based on what you have shared with me it looks like you have conjunctivitis.  Conjunctivitis is a common inflammatory or infectious condition of the eye that is often referred to as "pink eye".  In most cases it is contagious (viral or bacterial). However, not all conjunctivitis requires antibiotics (ex. Allergic).  We have made appropriate suggestions for you based upon your presentation. ? ?I have prescribed Polytrim Ophthalmic drops 1-2 drops 4 times a day times 5 days ? ?Pink eye can be highly contagious.  It is typically spread through direct contact with secretions, or contaminated objects or surfaces that one may have touched.  Strict handwashing is suggested with soap and water is urged.  If not available, use alcohol based had sanitizer.  Avoid unnecessary touching of the eye.  If you wear contact lenses, you will need to refrain from wearing them until you see no white discharge from the eye for at least 24 hours after being on medication.  You should see symptom improvement in 1-2 days after starting the medication regimen.  Call us if symptoms are not improved in 1-2 days. ? ?Home Care: ?Wash your hands often! ?Do not wear your contacts until you complete your treatment plan. ?Avoid sharing towels, bed linen, personal items with a person who has pink eye. ?See attention for anyone in your home with similar symptoms. ? ?Get Help Right Away If: ?Your symptoms do not improve. ?You develop blurred or loss of vision. ?Your symptoms worsen (increased discharge, pain or redness) ? ? ?Thank you for choosing an e-visit. ? ?Your e-visit answers were reviewed by a board certified advanced clinical practitioner to complete your personal care plan. Depending upon the condition, your plan could have included both over the counter or prescription medications. ? ?Please review your pharmacy choice. Make sure the  pharmacy is open so you can pick up prescription now. If there is a problem, you may contact your provider through MyChart messaging and have the prescription routed to another pharmacy.  Your safety is important to us. If you have drug allergies check your prescription carefully.  ? ?For the next 24 hours you can use MyChart to ask questions about today's visit, request a non-urgent call back, or ask for a work or school excuse. ?You will get an email in the next two days asking about your experience. I hope that your e-visit has been valuable and will speed your recovery. ? ?

## 2023-05-10 NOTE — Progress Notes (Signed)
I have spent 5 minutes in review of e-visit questionnaire, review and updating patient chart, medical decision making and response to patient.  ° °Jerrell Mangel W Secilia Apps, NP ° °  °

## 2023-06-11 ENCOUNTER — Other Ambulatory Visit (HOSPITAL_COMMUNITY): Payer: Self-pay

## 2023-06-11 ENCOUNTER — Telehealth: Payer: Commercial Managed Care - PPO | Admitting: Family Medicine

## 2023-06-11 DIAGNOSIS — J111 Influenza due to unidentified influenza virus with other respiratory manifestations: Secondary | ICD-10-CM | POA: Diagnosis not present

## 2023-06-11 MED ORDER — OSELTAMIVIR PHOSPHATE 75 MG PO CAPS
75.0000 mg | ORAL_CAPSULE | Freq: Two times a day (BID) | ORAL | 0 refills | Status: AC
  Filled 2023-06-11: qty 10, 5d supply, fill #0

## 2023-06-11 NOTE — Progress Notes (Signed)

## 2023-07-10 ENCOUNTER — Ambulatory Visit: Payer: 59 | Admitting: Family Medicine

## 2023-07-14 ENCOUNTER — Encounter: Payer: Self-pay | Admitting: Medical

## 2023-07-14 ENCOUNTER — Ambulatory Visit: Payer: Commercial Managed Care - PPO | Admitting: Medical

## 2023-07-14 VITALS — BP 134/80 | HR 99 | Temp 98.0°F | Resp 18 | Ht 70.0 in | Wt 234.8 lb

## 2023-07-14 DIAGNOSIS — R7989 Other specified abnormal findings of blood chemistry: Secondary | ICD-10-CM

## 2023-07-14 DIAGNOSIS — R6883 Chills (without fever): Secondary | ICD-10-CM

## 2023-07-14 DIAGNOSIS — N3943 Post-void dribbling: Secondary | ICD-10-CM

## 2023-07-14 DIAGNOSIS — R350 Frequency of micturition: Secondary | ICD-10-CM

## 2023-07-14 DIAGNOSIS — R739 Hyperglycemia, unspecified: Secondary | ICD-10-CM | POA: Diagnosis not present

## 2023-07-14 DIAGNOSIS — R319 Hematuria, unspecified: Secondary | ICD-10-CM

## 2023-07-14 DIAGNOSIS — D649 Anemia, unspecified: Secondary | ICD-10-CM

## 2023-07-14 DIAGNOSIS — R5383 Other fatigue: Secondary | ICD-10-CM

## 2023-07-14 LAB — POCT URINALYSIS DIPSTICK
Bilirubin, UA: NEGATIVE
Blood, UA: NEGATIVE
Glucose, UA: NEGATIVE
Leukocytes, UA: NEGATIVE
Nitrite, UA: NEGATIVE
Protein, UA: POSITIVE — AB
Spec Grav, UA: 1.01
Urobilinogen, UA: 2 U/dL — AB
pH, UA: 6

## 2023-07-14 LAB — CBC WITH DIFFERENTIAL/PLATELET
Basophils Absolute: 0.1 K/uL (ref 0.0–0.1)
Basophils Relative: 1 % (ref 0.0–3.0)
Eosinophils Absolute: 0.1 K/uL (ref 0.0–0.7)
Eosinophils Relative: 1.5 % (ref 0.0–5.0)
HCT: 36.2 % — ABNORMAL LOW (ref 39.0–52.0)
Hemoglobin: 12.1 g/dL — ABNORMAL LOW (ref 13.0–17.0)
Lymphocytes Relative: 52.7 % — ABNORMAL HIGH (ref 12.0–46.0)
Lymphs Abs: 5.1 K/uL — ABNORMAL HIGH (ref 0.7–4.0)
MCHC: 33.5 g/dL (ref 30.0–36.0)
MCV: 86.9 fl (ref 78.0–100.0)
Monocytes Absolute: 0.2 K/uL (ref 0.1–1.0)
Monocytes Relative: 2.4 % — ABNORMAL LOW (ref 3.0–12.0)
Neutro Abs: 4.1 K/uL (ref 1.4–7.7)
Neutrophils Relative %: 42.4 % — ABNORMAL LOW (ref 43.0–77.0)
Platelets: 206 K/uL (ref 150.0–400.0)
RBC: 4.17 Mil/uL — ABNORMAL LOW (ref 4.22–5.81)
RDW: 14.3 % (ref 11.5–15.5)
WBC: 9.8 K/uL (ref 4.0–10.5)

## 2023-07-14 LAB — COMPREHENSIVE METABOLIC PANEL WITH GFR
ALT: 67 U/L — ABNORMAL HIGH (ref 0–53)
AST: 73 U/L — ABNORMAL HIGH (ref 0–37)
Albumin: 3.8 g/dL (ref 3.5–5.2)
Alkaline Phosphatase: 89 U/L (ref 39–117)
BUN: 11 mg/dL (ref 6–23)
CO2: 31 meq/L (ref 19–32)
Calcium: 8.8 mg/dL (ref 8.4–10.5)
Chloride: 95 meq/L — ABNORMAL LOW (ref 96–112)
Creatinine, Ser: 1.11 mg/dL (ref 0.40–1.50)
GFR: 83.97 mL/min
Glucose, Bld: 92 mg/dL (ref 70–99)
Potassium: 4.1 meq/L (ref 3.5–5.1)
Sodium: 135 meq/L (ref 135–145)
Total Bilirubin: 0.8 mg/dL (ref 0.2–1.2)
Total Protein: 6.6 g/dL (ref 6.0–8.3)

## 2023-07-14 LAB — T4, FREE: Free T4: 0.72 ng/dL (ref 0.60–1.60)

## 2023-07-14 LAB — VITAMIN B12: Vitamin B-12: 250 pg/mL (ref 211–911)

## 2023-07-14 LAB — TSH: TSH: 0.51 u[IU]/mL (ref 0.35–5.50)

## 2023-07-14 LAB — PSA: PSA: 0.76 ng/mL (ref 0.10–4.00)

## 2023-07-14 LAB — HEMOGLOBIN A1C: Hgb A1c MFr Bld: 4.9 % (ref 4.6–6.5)

## 2023-07-14 NOTE — Progress Notes (Signed)
   Subjective:    Patient ID: Mitchell [name], male    DOB: [birth year], 38 y.o.   MRN: [id number]  HPI   Discussed the use of AI scribe software for clinical note transcription with the patient, who gave verbal consent to proceed.  History of Present Illness   Mitchell [name] is a 38 year old male who presents with urinary symptoms and fatigue.  He has been experiencing urinary symptoms for over a year, with a notable worsening in the past week. He describes significant post-void dribbling, with leakage occurring after urination once he puts his penis back in his pants. He also reports frequent urination, getting up three to four times at night, and consuming about six bottles of water during the night due to thirst. Over the past two weeks, he has noticed a change in urine color, describing it as 'light tea', and his wife has observed that it appears bloody. No perineum pain is reported.  He experiences significant fatigue, stating his energy level is 'zero', and he feels too tired to work or sleep. This fatigue has been persistent and is impacting his daily activities.  For about a month, he has been experiencing chills and body aches, which worsen with movement. He finds some relief with Tylenol, which he used last week to manage symptoms while at work. No back pain over the kidneys is reported.  He mentions a recent onset of congestion, sneezing, dry cough, and throat drainage, attributing these symptoms to allergies.         Review of Systems See hpi    Objective:   Physical Exam  General Mental Status- Alert. General Appearance- Not in acute distress.   Skin General: Color- Normal Color. Moisture- Normal Moisture.  Neck No JVD.  Chest and Lung Exam Auscultation: Breath Sounds:-Normal.  Cardiovascular Auscultation:Rythm- Regular. Murmurs & Other Heart Sounds:Auscultation of the heart reveals- No Murmurs.  Abdomen Inspection:-Inspeection  Normal. Palpation/Percussion:Note:No mass. Palpation and Percussion of the abdomen reveal- Non Tender, Non Distended + BS, no rebound or guarding.   Neurologic Cranial Nerve exam:- CN III-XII intact(No nystagmus), symmetric smile. Strength:- 5/5 equal and symmetric strength both upper and lower extremities.       Assessment & Plan:   Patient Instructions  Urinary Symptoms(considering prostatitis vs uti) Increased frequency and post-void leakage for over a year, worsened in the past week. Dark urine, possibly hematuria. Possible prostatitis or urinary tract infection. -Order urinalysis and urine culture. -Check PSA for potential prostatitis.  Generalized fatigue for one monh Fatigue, chills, and body aches for about a month. Possible infection or other systemic illness. -Order complete blood count and metabolic panel. -Check B12 levels due to fatigue. -tsh and t4  Elevated sugar Increased thirst and nocturnal water intake. Past elevated blood sugar levels. -Check HbA1c for three-month sugar average.  Follow-up Await lab results to guide further management. Potential for antibiotic treatment pending results. Follow-up timing to be determined based on lab results.

## 2023-07-14 NOTE — Addendum Note (Signed)
 Addended by: Gwenevere Abbot on: 07/14/2023 08:19 PM   Modules accepted: Orders

## 2023-07-14 NOTE — Patient Instructions (Addendum)
 Urinary Symptoms(considering prostatitis vs uti) Increased frequency and post-void leakage for over a year, worsened in the past week. Dark urine, possibly hematuria. Possible prostatitis or urinary tract infection. -Order urinalysis and urine culture. -Check PSA for potential prostatitis. -Will evaluate labs then decide on potential treatment.  Generalized fatigue for one monh Fatigue, chills, and body aches for about a month. Possible infection or other systemic illness. -Order complete blood count and metabolic panel. -Check B12 levels due to fatigue. -tsh and t4  Elevated sugar Increased thirst and nocturnal water intake. Past elevated blood sugar levels. -Check HbA1c for three-month sugar average.  Follow-up Await lab results to guide further management. Potential for antibiotic treatment pending results. Follow-up timing to be determined based on lab results.

## 2023-07-15 ENCOUNTER — Encounter: Payer: Self-pay | Admitting: Medical

## 2023-07-15 ENCOUNTER — Telehealth: Admitting: Family Medicine

## 2023-07-15 ENCOUNTER — Other Ambulatory Visit

## 2023-07-15 DIAGNOSIS — J111 Influenza due to unidentified influenza virus with other respiratory manifestations: Secondary | ICD-10-CM

## 2023-07-15 DIAGNOSIS — R0600 Dyspnea, unspecified: Secondary | ICD-10-CM

## 2023-07-15 DIAGNOSIS — D649 Anemia, unspecified: Secondary | ICD-10-CM | POA: Diagnosis not present

## 2023-07-15 LAB — IBC + FERRITIN
Ferritin: 1500 ng/mL — ABNORMAL HIGH (ref 22.0–322.0)
Iron: 51 ug/dL (ref 42–165)
Saturation Ratios: 20.8 % (ref 20.0–50.0)
TIBC: 245 ug/dL — ABNORMAL LOW (ref 250.0–450.0)
Transferrin: 175 mg/dL — ABNORMAL LOW (ref 212.0–360.0)

## 2023-07-15 LAB — URINE CULTURE
MICRO NUMBER:: 16150346
Result:: NO GROWTH
SPECIMEN QUALITY:: ADEQUATE

## 2023-07-15 NOTE — Progress Notes (Signed)
  Because Mr. Morici, I feel your condition warrants further evaluation and I recommend that you be seen in a face-to-face visit. Shortness of breath listed needs to be evaluated in an urgent care.    NOTE: There will be NO CHARGE for this E-Visit   If you are having a true medical emergency, please call 911.     For an urgent face to face visit, [facility] has multiple urgent care centers for your convenience.  Click the link below for the full list of locations and hours, walk-in wait times, appointment scheduling options and driving directions:  Urgent Care - [place], [place], [place], [place], [place], [place]  [place]     Your MyChart E-visit questionnaire answers were reviewed by a board certified advanced clinical practitioner to complete your personal care plan based on your specific symptoms.    Thank you for using e-Visits.      have provided 5 minutes of non face to face time during this encounter for chart review and documentation.

## 2023-07-15 NOTE — Addendum Note (Signed)
 Addended by: Mervin Kung A on: 07/15/2023 02:39 PM   Modules accepted: Orders

## 2023-07-16 ENCOUNTER — Other Ambulatory Visit: Payer: Self-pay

## 2023-07-16 ENCOUNTER — Emergency Department (HOSPITAL_BASED_OUTPATIENT_CLINIC_OR_DEPARTMENT_OTHER)
Admission: EM | Admit: 2023-07-16 | Discharge: 2023-07-16 | Disposition: A | Discharge status: Home / Self Care | Attending: Emergency Medicine | Admitting: Emergency Medicine

## 2023-07-16 ENCOUNTER — Encounter (HOSPITAL_BASED_OUTPATIENT_CLINIC_OR_DEPARTMENT_OTHER): Payer: Self-pay | Admitting: Emergency Medicine

## 2023-07-16 ENCOUNTER — Other Ambulatory Visit: Payer: Self-pay | Admitting: Family Medicine

## 2023-07-16 ENCOUNTER — Emergency Department (HOSPITAL_BASED_OUTPATIENT_CLINIC_OR_DEPARTMENT_OTHER)

## 2023-07-16 DIAGNOSIS — Z79899 Other long term (current) drug therapy: Secondary | ICD-10-CM | POA: Diagnosis not present

## 2023-07-16 DIAGNOSIS — J439 Emphysema, unspecified: Secondary | ICD-10-CM | POA: Diagnosis not present

## 2023-07-16 DIAGNOSIS — D649 Anemia, unspecified: Secondary | ICD-10-CM | POA: Insufficient documentation

## 2023-07-16 DIAGNOSIS — Z114 Encounter for screening for human immunodeficiency virus [HIV]: Secondary | ICD-10-CM

## 2023-07-16 DIAGNOSIS — R5383 Other fatigue: Secondary | ICD-10-CM | POA: Diagnosis not present

## 2023-07-16 DIAGNOSIS — R0602 Shortness of breath: Secondary | ICD-10-CM | POA: Diagnosis not present

## 2023-07-16 DIAGNOSIS — R161 Splenomegaly, not elsewhere classified: Secondary | ICD-10-CM | POA: Diagnosis not present

## 2023-07-16 DIAGNOSIS — R7989 Other specified abnormal findings of blood chemistry: Secondary | ICD-10-CM

## 2023-07-16 DIAGNOSIS — R918 Other nonspecific abnormal finding of lung field: Secondary | ICD-10-CM | POA: Diagnosis not present

## 2023-07-16 DIAGNOSIS — J9811 Atelectasis: Secondary | ICD-10-CM | POA: Diagnosis not present

## 2023-07-16 DIAGNOSIS — D72820 Lymphocytosis (symptomatic): Secondary | ICD-10-CM

## 2023-07-16 LAB — COMPREHENSIVE METABOLIC PANEL
ALT: 58 U/L — ABNORMAL HIGH (ref 0–44)
AST: 65 U/L — ABNORMAL HIGH (ref 15–41)
Albumin: 3.5 g/dL (ref 3.5–5.0)
Alkaline Phosphatase: 86 U/L (ref 38–126)
Anion gap: 8 (ref 5–15)
BUN: 12 mg/dL (ref 6–20)
CO2: 28 mmol/L (ref 22–32)
Calcium: 8.8 mg/dL — ABNORMAL LOW (ref 8.9–10.3)
Chloride: 99 mmol/L (ref 98–111)
Creatinine, Ser: 1.1 mg/dL (ref 0.61–1.24)
GFR, Estimated: >60 mL/min (ref >60)
Glucose, Bld: 95 mg/dL (ref 70–99)
Potassium: 4 mmol/L (ref 3.5–5.1)
Sodium: 135 mmol/L (ref 135–145)
Total Bilirubin: 0.9 mg/dL (ref 0.0–1.2)
Total Protein: 7.3 g/dL (ref 6.5–8.1)

## 2023-07-16 LAB — PROTIME-INR
INR: 1.1 (ref 0.8–1.2)
Prothrombin Time: 14 s (ref 11.4–15.2)

## 2023-07-16 LAB — CBC
HCT: 35.7 % — ABNORMAL LOW (ref 39.0–52.0)
Hemoglobin: 12.3 g/dL — ABNORMAL LOW (ref 13.0–17.0)
MCH: 29.2 pg (ref 26.0–34.0)
MCHC: 34.5 g/dL (ref 30.0–36.0)
MCV: 84.8 fL (ref 80.0–100.0)
Platelets: 199 10*3/uL (ref 150–400)
RBC: 4.21 MIL/uL — ABNORMAL LOW (ref 4.22–5.81)
RDW: 13.2 % (ref 11.5–15.5)
WBC: 9.8 10*3/uL (ref 4.0–10.5)
nRBC: 0 % (ref 0.0–0.2)

## 2023-07-16 LAB — RAPID HIV SCREEN (HIV 1/2 AB+AG)
HIV 1/2 Antibodies: NONREACTIVE
HIV-1 P24 Antigen - HIV24: NONREACTIVE

## 2023-07-16 LAB — HEPATITIS PANEL, ACUTE
HCV Ab: NONREACTIVE
Hep A IgM: NONREACTIVE
Hep B C IgM: NONREACTIVE
Hepatitis B Surface Ag: NONREACTIVE

## 2023-07-16 LAB — RESP PANEL BY RT-PCR (RSV, FLU A&B, COVID)  RVPGX2
Influenza A by PCR: NEGATIVE
Influenza B by PCR: NEGATIVE
Resp Syncytial Virus by PCR: NEGATIVE
SARS Coronavirus 2 by RT PCR: NEGATIVE

## 2023-07-16 LAB — LIPASE, BLOOD: Lipase: 20 U/L (ref 11–51)

## 2023-07-16 LAB — D-DIMER, QUANTITATIVE: D-Dimer, Quant: 2.7 ug{FEU}/mL — ABNORMAL HIGH (ref 0.00–0.50)

## 2023-07-16 LAB — MONONUCLEOSIS SCREEN: Mono Screen: NEGATIVE

## 2023-07-16 LAB — TROPONIN I (HIGH SENSITIVITY): Troponin I (High Sensitivity): 4 ng/L (ref <18)

## 2023-07-16 MED ORDER — ACETAMINOPHEN 325 MG PO TABS
650.0000 mg | ORAL_TABLET | Freq: Once | ORAL | Status: AC
  Administered 2023-07-16: 650 mg via ORAL
  Filled 2023-07-16: qty 2

## 2023-07-16 MED ORDER — IOHEXOL 350 MG/ML SOLN
100.0000 mL | Freq: Once | INTRAVENOUS | Status: AC | PRN
  Administered 2023-07-16: 100 mL via INTRAVENOUS

## 2023-07-16 NOTE — ED Triage Notes (Signed)
 States he was sent by PCP d/t elevated liver enzymes. C/o increased fatigue and body aches x 2 months   AST 73 ALT 67 Ferritin 1500

## 2023-07-16 NOTE — ED Triage Notes (Signed)
 States he was sent by PCP d/t elevated liver enzymes. C/o increased fatigue and body aches x 2 months.

## 2023-07-16 NOTE — ED Provider Notes (Signed)
 [facility] Provider Note   CSN: [id number] Arrival date & time: 07/16/23  1121     History  Chief Complaint  Patient presents with   Abnormal Labs    Mitchell [name] is a 38 y.o. male with past medical history significant for bipolar, anxiety, depression, hyperlipidemia who presents with concern for generalized fatigue, body aches for 2 months, sent by PCP due to elevated liver enzymes, ferritin with concern for possible hepatitis, HIV, liver failure, versus other.  Patient denies history of IV drug use, he reports sexually active only with 1 male partner, reports that he has been in prison previously.  HPI     Home Medications Prior to Admission medications   Medication Sig Start Date End Date Taking? Authorizing Provider  ARIPiprazole (ABILIFY) 5 MG tablet Take 1 tablet (5 mg total) by mouth at bedtime. 04/09/23   Karie Georges, MD  hydrOXYzine (VISTARIL) 25 MG capsule Take 1 capsule (25 mg total) by mouth every 8 (eight) hours as needed for anxiety. 04/09/23   Karie Georges, MD  meloxicam (MOBIC) 15 MG tablet Take 1 tablet (15 mg total) by mouth daily. 04/09/23   Karie Georges, MD  trimethoprim-polymyxin b Joaquim Lai) ophthalmic solution Place 1 drop into the left eye every 4 (four) hours. 05/10/23   Claiborne Rigg, NP      Allergies    Patient has no known allergies.    Review of Systems   Review of Systems  All other systems reviewed and are negative.   Physical Exam Updated Vital Signs BP 99/60   Pulse 89   Temp 98.7 F (37.1 C) (Oral)   Resp (!) 21   Ht 5\' 10"  (1.778 m)   Wt 106 kg   SpO2 97%   BMI 33.53 kg/m  Physical Exam Vitals and nursing note reviewed.  Constitutional:      General: He is not in acute distress.    Appearance: Normal appearance.  HENT:     Head: Normocephalic and atraumatic.  Eyes:     General:        Right eye: No discharge.        Left eye: No discharge.   Cardiovascular:     Rate and Rhythm: Normal rate and regular rhythm.     Heart sounds: No murmur heard.    No friction rub. No gallop.  Pulmonary:     Effort: Pulmonary effort is normal.     Breath sounds: Normal breath sounds.     Comments: No wheezing, rhonchi, stridor, rales Abdominal:     General: Bowel sounds are normal.     Palpations: Abdomen is soft.  Skin:    General: Skin is warm and dry.     Capillary Refill: Capillary refill takes less than 2 seconds.  Neurological:     Mental Status: He is alert and oriented to person, place, and time.  Psychiatric:        Mood and Affect: Mood normal.        Behavior: Behavior normal.     ED Results / Procedures / Treatments   Labs (all labs ordered are listed, but only abnormal results are displayed) Labs Reviewed  CBC - Abnormal; Notable for the following components:      Result Value   RBC 4.21 (*)    Hemoglobin 12.3 (*)    HCT 35.7 (*)    All other components within normal limits  COMPREHENSIVE METABOLIC PANEL -  Abnormal; Notable for the following components:   Calcium 8.8 (*)    AST 65 (*)    ALT 58 (*)    All other components within normal limits  D-DIMER, QUANTITATIVE - Abnormal; Notable for the following components:   D-Dimer, Quant 2.70 (*)    All other components within normal limits  RESP PANEL BY RT-PCR (RSV, FLU A&B, COVID)  RVPGX2  LIPASE, BLOOD  RAPID HIV SCREEN (HIV 1/2 AB+AG)  PROTIME-INR  MONONUCLEOSIS SCREEN  HEPATITIS PANEL, ACUTE  TROPONIN I (HIGH SENSITIVITY)    EKG EKG Interpretation Date/Time:  Wednesday July 16 2023 14:50:24 EST Ventricular Rate:  99 PR Interval:  158 QRS Duration:  90 QT Interval:  346 QTC Calculation: 444 R Axis:   7  Text Interpretation: Sinus rhythm Confirmed by Virgina Norfolk [phone]) on 07/16/2023 3:57:59 PM  Radiology DG Chest Portable 1 View Result Date: 07/16/2023 CLINICAL DATA:  Shortness of breath fatigue EXAM: PORTABLE CHEST 1 VIEW COMPARISON:  05/11/2018  FINDINGS: Minimal atelectasis or infiltrate left base. No pleural effusion. Normal cardiac size. No pneumothorax IMPRESSION: Minimal streaky atelectasis or infiltrate left base Electronically Signed   By: Jasmine Pang M.D.   On: 07/16/2023 16:58    Procedures Procedures    Medications Ordered in ED Medications  acetaminophen (TYLENOL) tablet 650 mg (650 mg Oral Given 07/16/23 1614)  iohexol (OMNIPAQUE) 350 MG/ML injection 100 mL (100 mLs Intravenous Contrast Given 07/16/23 1638)    ED Course/ Medical Decision Making/ A&P                                 Medical Decision Making Amount and/or Complexity of Data Reviewed Labs: ordered. Radiology: ordered.   This patient is a 38 y.o. male  who presents to the ED for concern of weakness, shob.   Differential diagnoses prior to evaluation: The emergent differential diagnosis includes, but is not limited to,  CVA, spinal cord injury, ACS, arrhythmia, syncope, orthostatic hypotension, sepsis, hypoglycemia, hypoxia, electrolyte disturbance, endocrine disorder, anemia, environmental exposure, polypharmacy . This is not an exhaustive differential.   Past Medical History / Co-morbidities / Social History: Bipolar, depression, anxiety  Additional history: Chart reviewed. Pertinent results include: Reviewed outpatient evaluation by urgent care, he had greater than 1500 ferritin on a series of labs evaluating his unspecified fatigue for 2 months, and was sent to rule out hepatitis, HIV or other acute abnormality, especially in context of his shortness of breath.  Physical Exam: Physical exam performed. The pertinent findings include: No wheezing, rhonchi, stridor, rales, overall he appears well, he has no tenderness to palpation of the abdomen, he developed a mild tachycardia in the ED, pulse of 101.  His oxygen saturation has remained stable.  No tensive blood pressure.  No fever.  Lab Tests/Imaging studies: I personally interpreted labs/imaging  and the pertinent results include: CBC notable for very mild anemia, hemoglobin 12.3, CMP notable for mildly elevated liver enzymes, AST 65, ALT 58, HIV is nonreactive, troponin normal x 1 in context of note chest pain, monoscreen negative, lipase normal, PT/INR within normal range, hepatitis panel pending, will not results during his ED visit, D-dimer shows 2.7, we will obtain CT PE study.  I independently interpreted plain film chest x-ray which shows no evidence of acute intra thoracic abnormality, additionally interpreted CT PE study which does not seem to show clot. Radiology read pending.   Cardiac monitoring: EKG obtained and interpreted by myself  and attending physician which shows: Normal sinus rhythm, no acute ST-T changes   Medications: I ordered medication including tylenol for bodyaches.  I have reviewed the patients home medicines and have made adjustments as needed.  5:04 PM Care of Mitchell [name] transferred to Dr. Lockie Mola at the end of my shift as the patient will require reassessment once labs/imaging have resulted. Patient presentation, ED course, and plan of care discussed with review of all pertinent labs and imaging. Please see his/her note for further details regarding further ED course and disposition. Plan at time of handoff is reassess after PE scan. This may be altered or completely changed at the discretion of the oncoming team pending results of further workup.  Final Clinical Impression(s) / ED Diagnoses Final diagnoses:  Other fatigue    Rx / DC Orders ED Discharge Orders     None         Olene Floss, PA-C 07/16/23 1704    Virgina Norfolk, DO 07/16/23 1720

## 2023-07-16 NOTE — Addendum Note (Signed)
 Addended by: Gwenevere Abbot on: 07/16/2023 04:12 PM   Modules accepted: Orders

## 2023-07-16 NOTE — Progress Notes (Signed)
 Yes please place the referral ASAP -- his liver function testing is also elevated so the ferritin may be related to some sort of liver pathology, or it could be an acute phase reactant -- possibly an acute infection like hepatitis or HIV?  Thank you for seeing him!

## 2023-07-16 NOTE — Progress Notes (Signed)
 I'm going to order additional testing for the patient -- please have him schedule a lab appointment here for additional bloodwork to rule out viral infections that could affect his liver.

## 2023-07-17 ENCOUNTER — Encounter: Payer: Self-pay | Admitting: Family Medicine

## 2023-07-17 ENCOUNTER — Encounter: Payer: Self-pay | Admitting: Physician Assistant

## 2023-07-17 DIAGNOSIS — R110 Nausea: Secondary | ICD-10-CM

## 2023-07-17 DIAGNOSIS — R1013 Epigastric pain: Secondary | ICD-10-CM

## 2023-07-18 ENCOUNTER — Other Ambulatory Visit (HOSPITAL_BASED_OUTPATIENT_CLINIC_OR_DEPARTMENT_OTHER): Payer: Self-pay

## 2023-07-18 MED ORDER — FAMOTIDINE 20 MG PO TABS
20.0000 mg | ORAL_TABLET | Freq: Two times a day (BID) | ORAL | 0 refills | Status: DC
  Filled 2023-07-18: qty 60, 30d supply, fill #0

## 2023-07-18 MED ORDER — ONDANSETRON HCL 4 MG PO TABS
4.0000 mg | ORAL_TABLET | Freq: Three times a day (TID) | ORAL | 1 refills | Status: AC | PRN
  Filled 2023-07-18: qty 30, 10d supply, fill #0
  Filled 2023-09-24: qty 30, 10d supply, fill #1

## 2023-07-23 ENCOUNTER — Encounter: Payer: Self-pay | Admitting: Family Medicine

## 2023-07-23 ENCOUNTER — Ambulatory Visit: Payer: 59 | Admitting: Family Medicine

## 2023-07-25 ENCOUNTER — Encounter: Payer: Self-pay | Admitting: Family Medicine

## 2023-07-25 ENCOUNTER — Ambulatory Visit (INDEPENDENT_AMBULATORY_CARE_PROVIDER_SITE_OTHER): Admitting: Family Medicine

## 2023-07-25 VITALS — BP 132/84 | HR 105 | Temp 98.2°F | Ht 70.0 in | Wt 227.8 lb

## 2023-07-25 DIAGNOSIS — R7989 Other specified abnormal findings of blood chemistry: Secondary | ICD-10-CM

## 2023-07-25 DIAGNOSIS — D72820 Lymphocytosis (symptomatic): Secondary | ICD-10-CM | POA: Diagnosis not present

## 2023-07-25 DIAGNOSIS — R161 Splenomegaly, not elsewhere classified: Secondary | ICD-10-CM | POA: Diagnosis not present

## 2023-07-25 LAB — SEDIMENTATION RATE: Sed Rate: 37 mm/h — ABNORMAL HIGH (ref 0–15)

## 2023-07-25 NOTE — Progress Notes (Signed)
 Established Patient Office Visit  Subjective   Patient ID: Mitchell [name], male    DOB: 30-Oct-1985  Age: 38 y.o. MRN: [id number]  Chief Complaint  Patient presents with   Follow-up    Patient presents for ER follow up    Pt is here for follow up on multiple complaints. He was seen on 3/3 for an acute symptoms of night sweats and chills, body aches, low energy. Patient states he is extremely fatigued all the time, and is having trouble working his job. States there is occasional nausea, decreased appetite. Has lost about 6 pounds in the last week. No changes in stooling.  Pt reports that the urinary symptoms-- urinary leaking, also the color is different, states that has been going on for 2-3 months, the leaking has been going on for much longer, since last year. UA was negative for infection on 3/3.   Pt is concerned because his testing is so far negative, not sure what is the cause of his symptoms.     Current Outpatient Medications  Medication Instructions   ARIPiprazole (ABILIFY) 5 mg, Oral, Daily at bedtime   famotidine (PEPCID) 20 mg, Oral, 2 times daily   hydrOXYzine (VISTARIL) 25 mg, Oral, Every 8 hours PRN   meloxicam (MOBIC) 15 mg, Oral, Daily   ondansetron (ZOFRAN) 4 mg, Oral, Every 8 hours PRN    Patient Active Problem List   Diagnosis Date Noted   Left-sided low back pain without sciatica 04/13/2023   Elevated blood pressure reading 04/13/2023   Bipolar affective disorder, mixed (HCC) 02/29/2020   Anxiety 02/29/2020   Depression, recurrent (HCC) 02/29/2020   Hyperlipidemia 08/18/2019   Reflux gastritis 08/03/2018   Hernia with obstruction 08/16/2013   S/P laparoscopic appendectomy 08/13/2013      Review of Systems  Constitutional:  Positive for chills, diaphoresis, fever, malaise/fatigue and weight loss.  Respiratory:  Negative for shortness of breath.   Cardiovascular:  Negative for chest pain.  Gastrointestinal:  Positive for nausea. Negative for  blood in stool and constipation.  All other systems reviewed and are negative.     Objective:     BP 132/84   Pulse (!) 105   Temp 98.2 F (36.8 C) (Oral)   Ht 5\' 10"  (1.778 m)   Wt 227 lb 12.8 oz (103.3 kg)   SpO2 99%   BMI 32.69 kg/m    Physical Exam Vitals reviewed.  Constitutional:      Appearance: Normal appearance. He is well-groomed and normal weight. He is ill-appearing (extremly fatigued appearing).  Neck:     Thyroid: No thyromegaly.  Cardiovascular:     Rate and Rhythm: Normal rate and regular rhythm.     Heart sounds: S1 normal and S2 normal. No murmur heard. Pulmonary:     Effort: Pulmonary effort is normal.     Breath sounds: Normal breath sounds and air entry. No rales.  Abdominal:     General: Abdomen is flat. Bowel sounds are normal.  Musculoskeletal:     Right lower leg: No edema.     Left lower leg: No edema.  Lymphadenopathy:     Cervical: No cervical adenopathy.  Neurological:     General: No focal deficit present.     Mental Status: He is alert and oriented to person, place, and time.     Gait: Gait is intact.  Psychiatric:        Mood and Affect: Mood and affect normal.      No results  found for any visits on 07/25/23.    The ASCVD Risk score (Arnett DK, et al., 2019) failed to calculate for the following reasons:   The 2019 ASCVD risk score is only valid for ages 97 to 35    Assessment & Plan:  Splenomegaly -     Sedimentation rate; Future -     ANA w/Reflex; Future -     Rheumatoid factor; Future -     Peripheral Blood Smear Review; Future   I extensively reviewed his progress notes and ER visit, reviewed his CT chest which showed mild splenomegaly and borderline enlarged LN. Labs so far showed a mild anemia but his ferritin is >1500 -- an acute phase reactant. All viral testing so far is negative. I counseled the patient on further work up that may be needed. Checking CMV, ESR, peripheral smear, ANA and rheumatoid. His appt with  the hematologist is Monday March 17th. Will continue to follow.   No follow-ups on file.    Karie Georges, MD

## 2023-07-26 LAB — ANA W/REFLEX: Anti Nuclear Antibody (ANA): NEGATIVE

## 2023-07-26 LAB — CMV ABS, IGG+IGM (CYTOMEGALOVIRUS)
CMV IgM: >240 [AU]/ml — ABNORMAL HIGH
Cytomegalovirus Ab-IgG: 4.5 U/mL — ABNORMAL HIGH

## 2023-07-28 ENCOUNTER — Encounter: Payer: Self-pay | Admitting: Family Medicine

## 2023-07-28 ENCOUNTER — Inpatient Hospital Stay: Admitting: Medical Oncology

## 2023-07-28 ENCOUNTER — Inpatient Hospital Stay: Attending: Medical Oncology

## 2023-07-28 ENCOUNTER — Encounter: Payer: Self-pay | Admitting: Medical Oncology

## 2023-07-28 VITALS — BP 134/85 | HR 99 | Temp 99.1°F | Resp 18 | Ht 70.0 in | Wt 225.4 lb

## 2023-07-28 DIAGNOSIS — R5383 Other fatigue: Secondary | ICD-10-CM

## 2023-07-28 DIAGNOSIS — F17210 Nicotine dependence, cigarettes, uncomplicated: Secondary | ICD-10-CM

## 2023-07-28 DIAGNOSIS — B259 Cytomegaloviral disease, unspecified: Secondary | ICD-10-CM

## 2023-07-28 DIAGNOSIS — G8929 Other chronic pain: Secondary | ICD-10-CM | POA: Diagnosis not present

## 2023-07-28 DIAGNOSIS — R161 Splenomegaly, not elsewhere classified: Secondary | ICD-10-CM | POA: Diagnosis not present

## 2023-07-28 DIAGNOSIS — R109 Unspecified abdominal pain: Secondary | ICD-10-CM | POA: Diagnosis not present

## 2023-07-28 DIAGNOSIS — R7989 Other specified abnormal findings of blood chemistry: Secondary | ICD-10-CM

## 2023-07-28 LAB — CBC WITH DIFFERENTIAL (CANCER CENTER ONLY)
Abs Immature Granulocytes: 0.02 10*3/uL (ref 0.00–0.07)
Basophils Absolute: 0.1 10*3/uL (ref 0.0–0.1)
Basophils Relative: 1 %
Eosinophils Absolute: 0 10*3/uL (ref 0.0–0.5)
Eosinophils Relative: 1 %
HCT: 36.6 % — ABNORMAL LOW (ref 39.0–52.0)
Hemoglobin: 12.3 g/dL — ABNORMAL LOW (ref 13.0–17.0)
Immature Granulocytes: 0 %
Lymphocytes Relative: 57 %
Lymphs Abs: 4.3 10*3/uL — ABNORMAL HIGH (ref 0.7–4.0)
MCH: 29 pg (ref 26.0–34.0)
MCHC: 33.6 g/dL (ref 30.0–36.0)
MCV: 86.3 fL (ref 80.0–100.0)
Monocytes Absolute: 0.4 10*3/uL (ref 0.1–1.0)
Monocytes Relative: 6 %
Neutro Abs: 2.6 10*3/uL (ref 1.7–7.7)
Neutrophils Relative %: 35 %
Platelet Count: 210 10*3/uL (ref 150–400)
RBC: 4.24 MIL/uL (ref 4.22–5.81)
RDW: 14.8 % (ref 11.5–15.5)
Smear Review: NORMAL
WBC Count: 7.5 10*3/uL (ref 4.0–10.5)
nRBC: 0 % (ref 0.0–0.2)

## 2023-07-28 LAB — CMP (CANCER CENTER ONLY)
ALT: 36 U/L (ref 0–44)
AST: 47 U/L — ABNORMAL HIGH (ref 15–41)
Albumin: 4.5 g/dL (ref 3.5–5.0)
Alkaline Phosphatase: 90 U/L (ref 38–126)
Anion gap: 11 (ref 5–15)
BUN: 6 mg/dL (ref 6–20)
CO2: 26 mmol/L (ref 22–32)
Calcium: 9.7 mg/dL (ref 8.9–10.3)
Chloride: 99 mmol/L (ref 98–111)
Creatinine: 0.9 mg/dL (ref 0.61–1.24)
GFR, Estimated: >60 mL/min (ref >60)
Glucose, Bld: 91 mg/dL (ref 70–99)
Potassium: 4 mmol/L (ref 3.5–5.1)
Sodium: 136 mmol/L (ref 135–145)
Total Bilirubin: 0.6 mg/dL (ref 0.0–1.2)
Total Protein: 7.9 g/dL (ref 6.5–8.1)

## 2023-07-28 LAB — EXTRA LAV TOP TUBE

## 2023-07-28 LAB — RETICULOCYTES
Immature Retic Fract: 35 % — ABNORMAL HIGH (ref 2.3–15.9)
RBC.: 4.26 MIL/uL (ref 4.22–5.81)
Retic Count, Absolute: 240.3 10*3/uL — ABNORMAL HIGH (ref 19.0–186.0)
Retic Ct Pct: 5.6 % — ABNORMAL HIGH (ref 0.4–3.1)

## 2023-07-28 LAB — SAVE SMEAR(SSMR), FOR PROVIDER SLIDE REVIEW

## 2023-07-28 LAB — IRON AND IRON BINDING CAPACITY (CC-WL,HP ONLY)
Iron: 71 ug/dL (ref 45–182)
Saturation Ratios: 22 % (ref 17.9–39.5)
TIBC: 325 ug/dL (ref 250–450)
UIBC: 254 ug/dL (ref 117–376)

## 2023-07-28 LAB — LACTATE DEHYDROGENASE: LDH: 446 U/L — ABNORMAL HIGH (ref 98–192)

## 2023-07-28 LAB — FERRITIN: Ferritin: 1165 ng/mL — ABNORMAL HIGH (ref 24–336)

## 2023-07-28 LAB — PERIPHERAL BLOOD SMEAR REVIEW

## 2023-07-28 LAB — RHEUMATOID FACTOR: Rheumatoid fact SerPl-aCnc: 12 [IU]/mL (ref <14)

## 2023-07-28 LAB — VITAMIN B12: Vitamin B-12: 274 pg/mL (ref 180–914)

## 2023-07-28 NOTE — Progress Notes (Signed)
 [facility] [phone]   [phone]  INITIAL CONSULT NOTE  Patient Care Team: Karie Georges, MD as PCP - General (Family Medicine)  CHIEF COMPLAINTS/PURPOSE OF CONSULTATION:  Anemia, elevated ferritin  HISTORY OF PRESENTING ILLNESS:  Mitchell [name] 38 y.o. male is referred to our office by their PCP Dr. Nira Conn for Fatigue, anemia and elevated ferritin. He is here with his wife Mitchell [name] on speaker phone.   He was seen in the ER for fatigue/elevated LFTs/elevated ferritin levels on 07/16/2023 after being referred by his PCP. There he had additional lab work and imaging. He was found to have cytomegalovirus. Imaging showed mild splenomegaly. CBC showed a Hgb of 12.3, elevated LFTS (AST 65/ ALT 58), Sed rate of 37. Hep C negative, HIV Negative. Acute hepatitis Negative. It was thought that his elevated ferritin was due to an acute phase reactant. He also had a B12 level drawn by his PCP on 07/14/2023 that was low at 250.   He reports that he has been feeling fatigued for months. In the beginning he would have to pull over and rest from driving every 3-4 hours. Now he is able to go much longer but still is not back at his baseline. Of note, He has not been started on B12 supplementation.   He was referred to GI for his chronic abdominal pains, elevated LFTS, etc. His wife who joins [place] over the phone states that his appointment is on April, 22nd 2025.    There has been no bleeding to his knowledge: denies epistaxis, gingivitis, hemoptysis, hematemesis, hematuria, melena, excessive bruising, blood donation.   He has had dark urine for a few months. No history of kidney stones. No high intensity work outs or injury.   He reports, night sweats, headaches. No rash of skin. No jaundice of skin or eyes.   He does not take any supplements  ETOH is 1-2 times per month. Amount can vary greatly from an airplane bottle to much greater. 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 He does report a high fat diet given the nature of his work. He also takes tylenol "often". He takes at least 800 mg daily but normally much more. He has done this for many years per patient secondary to chronic back pain.   Appetite is down significantly over the past few weeks  In terms of family history he believes that his paternal cousin and uncle both had/have liver disease though he does report that they drink heavily. No known hemochromatosis.   Wt Readings from Last 3 Encounters:  07/28/23 225 lb 6.4 oz (102.2 kg)  07/25/23 227 lb 12.8 oz (103.3 kg)  07/16/23 233 lb 11 oz (106 kg)      MEDICAL HISTORY:  Past Medical History:  Diagnosis Date   Appendicitis 2015   Bowel obstruction (HCC)    Glaucoma    H/O left inguinal hernia repair    Moderate alcohol use disorder in controlled environment (HCC)    Tobacco dependence     SURGICAL HISTORY: Past Surgical History:  Procedure Laterality Date   HERNIA REPAIR     I & D EXTREMITY Right 08/29/2016   Procedure: IRRIGATION AND DEBRIDEMENT OF RIGHT HAND;  Surgeon: Knute Neu, MD;  Location: [facility] OR;  Service: Plastics;  Laterality: Right;   LAPAROSCOPIC APPENDECTOMY N/A 08/13/2013   Procedure: APPENDECTOMY LAPAROSCOPIC;  Surgeon: Liz Malady, MD;  Location[facility];  Service: General;  Laterality: N/A;   LAPAROTOMY N/A 08/16/2013   Procedure: INCARCERATED ABDOMINAL WALL HERNIA REPAIR;  Surgeon: Cherylynn Ridges, MD;  Location: [facility] OR;  Service: General;  Laterality: N/A;    SOCIAL HISTORY: Social History   Socioeconomic History   Marital status: Married    Spouse name: Not on file   Number of children: Not on file   Years of education: Not on file   Highest education level: Not on file  Occupational History   Not on file  Tobacco Use   Smoking status: Every Day    Current packs/day: 1.00    Types: Cigarettes   Smokeless  tobacco: Never   Tobacco comments:    Smokes about one pack/day  Vaping Use   Vaping status: Never Used  Substance and Sexual Activity   Alcohol use: Yes    Comment: will drink large quantities when he does drink; avoids when working   Drug use: Yes    Types: Marijuana   Sexual activity: Yes  Other Topics Concern   Not on file  Social History Narrative   ** Merged History Encounter **       Social Drivers of Health   Financial Resource Strain: Not on file  Food Insecurity: Patient Declined (07/28/2023)   Hunger Vital Sign    Worried About Running Out of Food in the Last Year: Patient declined    Ran Out of Food in the Last Year: Patient declined  Transportation Needs: Patient Declined (07/28/2023)   PRAPARE - Administrator, Civil Service (Medical): Patient declined    Lack of Transportation (Non-Medical): Patient declined  Physical Activity: Not on file  Stress: Not on file  Social Connections: Not on file  Intimate Partner Violence: Patient Declined (07/28/2023)   Humiliation, Afraid, Rape, and Kick questionnaire    Fear of Current or Ex-Partner: Patient declined    Emotionally Abused: Patient declined    Physically Abused: Patient declined    Sexually Abused: Patient declined    FAMILY HISTORY: Family History  Problem Relation Age of Onset   Migraines Mother    COPD Father    Gout Father    Diabetes Maternal Grandmother     ALLERGIES:  has no known allergies.  MEDICATIONS:  Current Outpatient Medications  Medication Sig Dispense Refill   ARIPiprazole (ABILIFY) 5 MG tablet Take 1 tablet (5 mg total) by mouth at bedtime. 30 tablet 2   famotidine (PEPCID) 20 MG tablet Take 1 tablet (20 mg total) by mouth 2 (two) times daily. 60 tablet 0   hydrOXYzine (VISTARIL) 25 MG capsule Take 1 capsule (25 mg total) by mouth every 8 (eight) hours as needed for anxiety. 30 capsule 2   meloxicam (MOBIC) 15 MG tablet Take 1 tablet (15 mg total) by mouth daily. 90  tablet 1   ondansetron (ZOFRAN) 4 MG tablet Take 1 tablet (4 mg total) by mouth every 8 (eight) hours as needed for nausea or vomiting. 30 tablet 1   No current facility-administered medications for this visit.    REVIEW OF SYSTEMS:   Constitutional: ( - ) fevers, ( - )  chills , ( + ) night sweats Eyes: ( - ) blurriness of vision, ( - ) double vision, ( - ) watery eyes Ears, nose, mouth, throat, and face: ( - ) mucositis, ( - ) sore throat Respiratory: ( - ) cough, ( - ) dyspnea, ( - )  wheezes Cardiovascular: ( - ) palpitation, ( - ) chest discomfort, ( - ) lower extremity swelling Gastrointestinal:  ( - ) nausea, ( - ) heartburn, ( - ) change in bowel habits Skin: ( - ) abnormal skin rashes Lymphatics: ( - ) new lymphadenopathy, ( - ) easy bruising Neurological: ( - ) numbness, ( - ) tingling, ( - ) new weaknesses Behavioral/Psych: ( - ) mood change, ( - ) new changes  All other systems were reviewed with the patient and are negative.  PHYSICAL EXAMINATION: ECOG PERFORMANCE STATUS: 1 - Symptomatic but completely ambulatory  Vitals:   07/28/23 1303  BP: 134/85  Pulse: 99  Resp: 18  Temp: 99.1 F (37.3 C)  SpO2: 100%   Filed Weights   07/28/23 1303  Weight: 225 lb 6.4 oz (102.2 kg)    GENERAL: well appearing male in NAD  SKIN: skin color, texture, turgor are normal, no rashes or significant lesions EYES: conjunctiva are pink and non-injected, sclera clear OROPHARYNX: no exudate, no erythema; lips, buccal mucosa, and tongue normal  NECK: supple, non-tender LYMPH:  no palpable lymphadenopathy in the cervical, axillary or supraclavicular lymph nodes.  LUNGS: clear to auscultation and percussion with normal breathing effort HEART: regular rate & rhythm and no murmurs and no lower extremity edema ABDOMEN: soft, non-tender, non-distended, normal bowel sounds Musculoskeletal: no cyanosis of digits and no clubbing  PSYCH: alert & oriented x 3, fluent speech NEURO: no focal  motor/sensory deficits  LABORATORY DATA:  Pending   ASSESSMENT & PLAN Eyob Godlewski [place] is a 38 y.o. african [place] male who was referred to [place] for elevated ferritin level.   I have reviewed his records from his ER visit and last visit with his PCP.    We discussed that elevated serum ferritin levels have numerous possible etiologies. These include hereditary hemochromatosis (heterozygous or homozygous), inflammation, liver disease, or iron overload from an exogenous source. Hereditary hemochromatosis is a hereditary condition caused by mutations in the HFE gene, which regulates iron absorption. The most common genes mutated in this condition are the C282Y and H63D genes. Homozygous mutations represent a disease state which requires phlebotomy to decrease ferritin levels to a goal of <50  (Blood (2010) 116 (3): 317-325). The goal is to decrease ferritin so there is no deposition in critical organs (liver, heart, pancreas and thyroid). Heterozygous mutations (or compound heterozygotes) rarely require phlebotomy, but do have elevated serum iron/ferritin levels.  Ferritin is an acute phase reactant and can be elevated with systemic inflammation. Direct damage to liver tissue can also cause spillage of ferritin into the blood, resulting in elevated ferritin.  Additionally, serum iron levels can be quite transient and an elevation or serum iron may not represent a true overload of total body iron (best lab for this is ferritin).   Given his history I do suspect a level of APR and liver involvement.   I have asked that he stop his tylenol use and switch to one aleve per day I have asked that he try to follow a low fat diet and avoid ETOH It is also highly important that he keep his follow up with GI  # Elevated Iron/Ferritin Repeating labs today and adding some additional ones including for HFE mutation --will send for HFE gene mutation. If found to have homozygous mutation for HFE will begin  phlebotomies every other week with goal ferritin <50 as long as hgb is >12 --RTC pending results of above studies.    All  questions were answered. The patient knows to call the clinic with any problems, questions or concerns.  I have spent a total of 55 minutes minutes of face-to-face and non-face-to-face time, preparing to see the patient, obtaining and/or reviewing separately obtained history, performing a medically appropriate examination, counseling and educating the patient, ordering medications/tests/procedures, referring and communicating with other health care professionals, documenting clinical information in the electronic health record, independently interpreting results and communicating results to the patient, and care coordination.    Clent Jacks PA-C Department of Hematology/Oncology [facility]

## 2023-07-29 ENCOUNTER — Other Ambulatory Visit: Payer: Self-pay | Admitting: Medical Oncology

## 2023-07-29 ENCOUNTER — Encounter: Payer: Self-pay | Admitting: Medical Oncology

## 2023-07-29 DIAGNOSIS — R7989 Other specified abnormal findings of blood chemistry: Secondary | ICD-10-CM

## 2023-07-29 DIAGNOSIS — B259 Cytomegaloviral disease, unspecified: Secondary | ICD-10-CM

## 2023-07-29 DIAGNOSIS — R5383 Other fatigue: Secondary | ICD-10-CM

## 2023-07-30 DIAGNOSIS — R0902 Hypoxemia: Secondary | ICD-10-CM | POA: Diagnosis not present

## 2023-07-30 DIAGNOSIS — R000 Tachycardia, unspecified: Secondary | ICD-10-CM | POA: Diagnosis not present

## 2023-08-19 ENCOUNTER — Encounter: Payer: Self-pay | Admitting: Family Medicine

## 2023-09-01 ENCOUNTER — Inpatient Hospital Stay: Admitting: Medical Oncology

## 2023-09-01 ENCOUNTER — Inpatient Hospital Stay

## 2023-09-01 ENCOUNTER — Encounter: Payer: Self-pay | Admitting: Medical Oncology

## 2023-09-01 ENCOUNTER — Inpatient Hospital Stay (HOSPITAL_BASED_OUTPATIENT_CLINIC_OR_DEPARTMENT_OTHER): Admitting: Medical Oncology

## 2023-09-01 ENCOUNTER — Other Ambulatory Visit: Payer: Self-pay | Admitting: *Deleted

## 2023-09-01 ENCOUNTER — Inpatient Hospital Stay: Attending: Medical Oncology

## 2023-09-01 VITALS — BP 124/71 | HR 95 | Temp 98.8°F | Resp 17 | Ht 70.0 in | Wt 213.0 lb

## 2023-09-01 DIAGNOSIS — R5383 Other fatigue: Secondary | ICD-10-CM

## 2023-09-01 DIAGNOSIS — D649 Anemia, unspecified: Secondary | ICD-10-CM | POA: Insufficient documentation

## 2023-09-01 DIAGNOSIS — B259 Cytomegaloviral disease, unspecified: Secondary | ICD-10-CM

## 2023-09-01 DIAGNOSIS — R7989 Other specified abnormal findings of blood chemistry: Secondary | ICD-10-CM | POA: Diagnosis not present

## 2023-09-01 DIAGNOSIS — E538 Deficiency of other specified B group vitamins: Secondary | ICD-10-CM | POA: Diagnosis not present

## 2023-09-01 LAB — CBC WITH DIFFERENTIAL (CANCER CENTER ONLY)
Abs Immature Granulocytes: 0.05 10*3/uL (ref 0.00–0.07)
Basophils Absolute: 0 10*3/uL (ref 0.0–0.1)
Basophils Relative: 0 %
Eosinophils Absolute: 0.1 10*3/uL (ref 0.0–0.5)
Eosinophils Relative: 2 %
HCT: 42.2 % (ref 39.0–52.0)
Hemoglobin: 14.5 g/dL (ref 13.0–17.0)
Immature Granulocytes: 1 %
Lymphocytes Relative: 41 %
Lymphs Abs: 2.9 10*3/uL (ref 0.7–4.0)
MCH: 29.1 pg (ref 26.0–34.0)
MCHC: 34.4 g/dL (ref 30.0–36.0)
MCV: 84.7 fL (ref 80.0–100.0)
Monocytes Absolute: 0.4 10*3/uL (ref 0.1–1.0)
Monocytes Relative: 5 %
Neutro Abs: 3.6 10*3/uL (ref 1.7–7.7)
Neutrophils Relative %: 51 %
Platelet Count: 190 10*3/uL (ref 150–400)
RBC: 4.98 MIL/uL (ref 4.22–5.81)
RDW: 14.4 % (ref 11.5–15.5)
WBC Count: 7 10*3/uL (ref 4.0–10.5)
nRBC: 0 % (ref 0.0–0.2)

## 2023-09-01 LAB — CMP (CANCER CENTER ONLY)
ALT: 23 U/L (ref 0–44)
AST: 61 U/L — ABNORMAL HIGH (ref 15–41)
Albumin: 4.5 g/dL (ref 3.5–5.0)
Alkaline Phosphatase: 83 U/L (ref 38–126)
Anion gap: 15 (ref 5–15)
BUN: 9 mg/dL (ref 6–20)
CO2: 25 mmol/L (ref 22–32)
Calcium: 9.7 mg/dL (ref 8.9–10.3)
Chloride: 101 mmol/L (ref 98–111)
Creatinine: 0.93 mg/dL (ref 0.61–1.24)
GFR, Estimated: >60 mL/min (ref >60)
Glucose, Bld: 105 mg/dL — ABNORMAL HIGH (ref 70–99)
Potassium: 3.6 mmol/L (ref 3.5–5.1)
Sodium: 141 mmol/L (ref 135–145)
Total Bilirubin: 0.6 mg/dL (ref 0.0–1.2)
Total Protein: 7.2 g/dL (ref 6.5–8.1)

## 2023-09-01 LAB — FERRITIN: Ferritin: 162 ng/mL (ref 24–336)

## 2023-09-01 LAB — VITAMIN B12: Vitamin B-12: 191 pg/mL (ref 180–914)

## 2023-09-01 LAB — LACTATE DEHYDROGENASE: LDH: 271 U/L — ABNORMAL HIGH (ref 98–192)

## 2023-09-01 NOTE — Progress Notes (Signed)
 Hematology and Oncology Follow Up Visit  Mitchell [name] [id number] [birth year] 38 y.o. 09/01/2023  Past Medical History:  Diagnosis Date   Anemia    Appendicitis 2015   Bowel obstruction (HCC)    Glaucoma    H/O left inguinal hernia repair    Moderate alcohol use disorder in controlled environment (HCC)    Tobacco dependence     Principle Diagnosis:  Anemia Elevated Ferritin  Current Therapy:   Oral B12- 1,000 mcg once daily - Pt has not yet started    Related Past History:  He was seen in the ER for fatigue/elevated LFTs/elevated ferritin levels on 07/16/2023 after being referred by his PCP. There he had additional lab work and imaging. He was found to have cytomegalovirus. Imaging showed mild splenomegaly. CBC showed a Hgb of 12.3, elevated LFTS (AST 65/ ALT 58), Sed rate of 37. Hep C negative, HIV Negative. Acute hepatitis Negative. It was thought that his elevated ferritin was due to an acute phase reactant. He also had a B12 level drawn by his PCP on 07/14/2023 that was low at 250.   He has been referred to GI for his anemia, chronic abdominal pains, elevated LFTs. This appointment is scheduled for tomorrow. Excessive tylenol  use. Variable ETOH use.   Interim History:  Mitchell [name] is back for follow-up for his anemia and elevated ferritin level:  Today patient states that he has been about the same since his last visit. He has noticed continued weight loss.   At our last visit he was asked to try to reduce his tylenol  and ETOH use.     He was found to have a low vitamin B12 level and was asked to start B12 supplementation as well. He states that he went out of town and forgot his B12. He will start this now. He will plan to take this daily.   He reports that he is taking tylenol  1-2 times per week. He takes 500 mg when he takes this.   He has reduced his ETOH intake.   No vomiting, abdominal pain is about the same, no nausea, vomiting, constipation. No fevers.  He is still getting night sweats nightly. His baseline weight is 237lbs  Wt Readings from Last 3 Encounters:  07/28/23 225 lb 6.4 oz (102.2 kg)  07/25/23 227 lb 12.8 oz (103.3 kg)  07/16/23 233 lb 11 oz (106 kg)     Medications:   Current Outpatient Medications:    ARIPiprazole  (ABILIFY ) 5 MG tablet, Take 1 tablet (5 mg total) by mouth at bedtime., Disp: 30 tablet, Rfl: 2   famotidine  (PEPCID ) 20 MG tablet, Take 1 tablet (20 mg total) by mouth 2 (two) times daily., Disp: 60 tablet, Rfl: 0   hydrOXYzine  (VISTARIL ) 25 MG capsule, Take 1 capsule (25 mg total) by mouth every 8 (eight) hours as needed for anxiety., Disp: 30 capsule, Rfl: 2   meloxicam  (MOBIC ) 15 MG tablet, Take 1 tablet (15 mg total) by mouth daily., Disp: 90 tablet, Rfl: 1   ondansetron  (ZOFRAN ) 4 MG tablet, Take 1 tablet (4 mg total) by mouth every 8 (eight) hours as needed for nausea or vomiting., Disp: 30 tablet, Rfl: 1  Allergies: No Known Allergies  Past Medical History, Surgical history, Social history, and Family History were reviewed and updated.  Review of Systems: Review of Systems - Oncology   Physical Exam:  vitals were not taken for this visit.   Physical Exam General: NAD Cardiovascular: regular rate and rhythm Pulmonary: clear  ant fields Abdomen: soft, nontender, + bowel sounds GU: no suprapubic tenderness Extremities: no edema, no joint deformities Skin: no rashes Neurological: Weakness but otherwise nonfocal   Lab Results  Component Value Date   WBC 7.5 07/28/2023   HGB 12.3 (L) 07/28/2023   HCT 36.6 (L) 07/28/2023   MCV 86.3 07/28/2023   PLT 210 07/28/2023     Chemistry      Component Value Date/Time   NA 136 07/28/2023 1358   NA 141 04/20/2014 0608   K 4.0 07/28/2023 1358   K 3.6 04/20/2014 0608   CL 99 07/28/2023 1358   CL 106 04/20/2014 0608   CO2 26 07/28/2023 1358   CO2 29 04/20/2014 0608   BUN 6 07/28/2023 1358   BUN 8 04/20/2014 0608   CREATININE 0.90 07/28/2023 1358    CREATININE 1.11 04/20/2014 0608      Component Value Date/Time   CALCIUM 9.7 07/28/2023 1358   CALCIUM 8.2 (L) 04/20/2014 0608   ALKPHOS 90 07/28/2023 1358   ALKPHOS 76 04/20/2014 0608   AST 47 (H) 07/28/2023 1358   ALT 36 07/28/2023 1358   ALT 19 04/20/2014 0608   BILITOT 0.6 07/28/2023 1358     Encounter Diagnosis  Name Primary?   Elevated ferritin Yes    Assessment and Plan- Patient is a 38 y.o. male who was referred to our office for elevated ferritin and anemia. He was found to be deficient in B12 and was started on oral supplementation. Based on his history it was suspected that his elevated ferritin level was possibly related to liver disease. He was asked to reduce ETOH and tylenol  use. He has a new patient appointment with GI tomorrow.     He will restart oral B12 He will keep his GI appointment He will continue to lower ETOH and tylenol  use  Disposition: RTC 1 month APP, labs (CBC, CMP, B12, iron, ferritin, folate, retic)   Sunnie England PA-C 4/21/20252:15 PM

## 2023-09-02 ENCOUNTER — Ambulatory Visit: Admitting: Physician Assistant

## 2023-09-02 ENCOUNTER — Encounter: Payer: Self-pay | Admitting: Gastroenterology

## 2023-09-02 LAB — IRON AND IRON BINDING CAPACITY (CC-WL,HP ONLY)
Iron: 61 ug/dL (ref 45–182)
Saturation Ratios: 17 % — ABNORMAL LOW (ref 17.9–39.5)
TIBC: 351 ug/dL (ref 250–450)
UIBC: 290 ug/dL (ref 117–376)

## 2023-09-03 ENCOUNTER — Encounter: Payer: Self-pay | Admitting: Medical Oncology

## 2023-09-04 LAB — HEMOCHROMATOSIS DNA-PCR(C282Y,H63D)

## 2023-09-08 ENCOUNTER — Encounter: Payer: Self-pay | Admitting: Medical Oncology

## 2023-09-24 ENCOUNTER — Other Ambulatory Visit: Payer: Self-pay | Admitting: Family Medicine

## 2023-09-24 ENCOUNTER — Other Ambulatory Visit: Payer: Self-pay

## 2023-09-24 DIAGNOSIS — R1013 Epigastric pain: Secondary | ICD-10-CM

## 2023-09-25 ENCOUNTER — Other Ambulatory Visit (HOSPITAL_COMMUNITY): Payer: Self-pay

## 2023-09-25 MED ORDER — FAMOTIDINE 20 MG PO TABS
20.0000 mg | ORAL_TABLET | Freq: Two times a day (BID) | ORAL | 2 refills | Status: AC
Start: 2023-09-25 — End: ?
  Filled 2023-09-25: qty 60, 30d supply, fill #0

## 2023-10-01 ENCOUNTER — Inpatient Hospital Stay

## 2023-10-01 ENCOUNTER — Inpatient Hospital Stay: Admitting: Medical Oncology

## 2023-10-03 IMAGING — DX DG LUMBAR SPINE COMPLETE 4+V
5 series · 5 of 5 positions shown · non-contrast
Comparison: Lumbar radiograph 12 days ago. Reformats from
abdominopelvic CT 06/14/2014

CLINICAL DATA: Left-sided low back pain for 2 months after fall.

EXAM:
LUMBAR SPINE - COMPLETE 4+ VIEW

[l-spine ap]
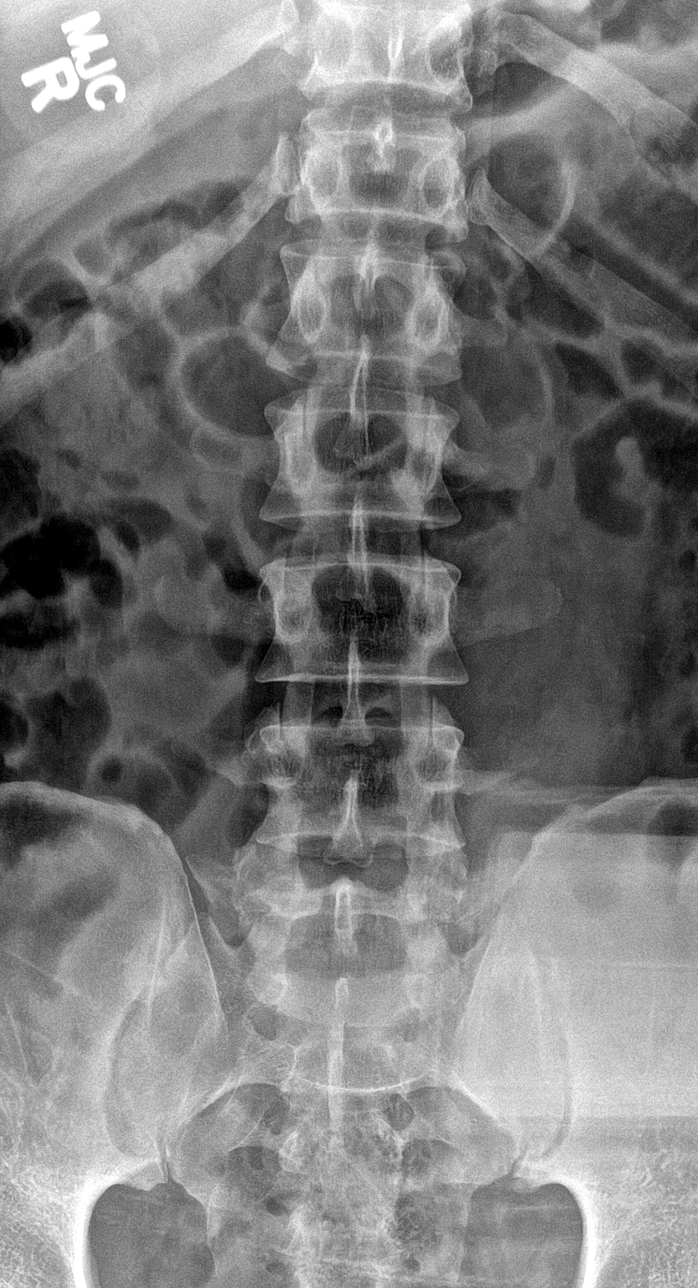

[l-spine obl (1 of 2)]
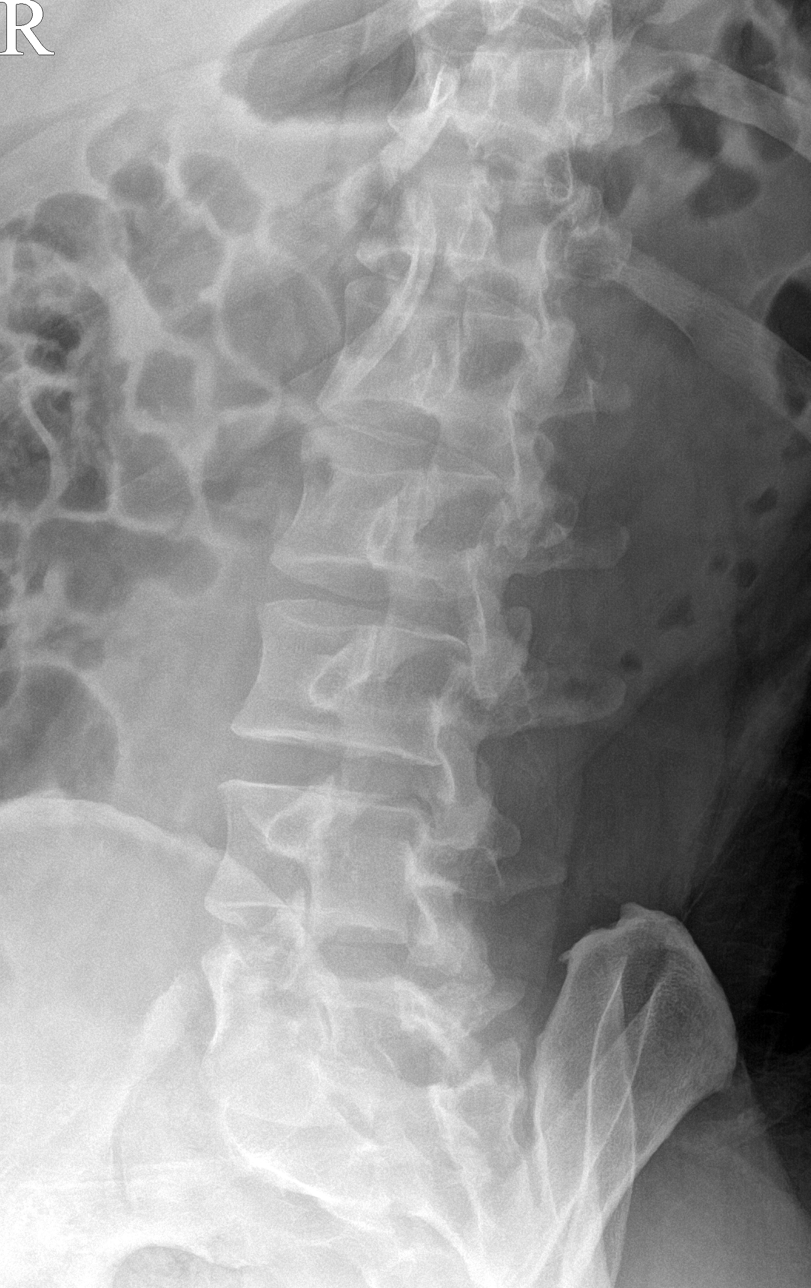

[l-spine obl (2 of 2)]
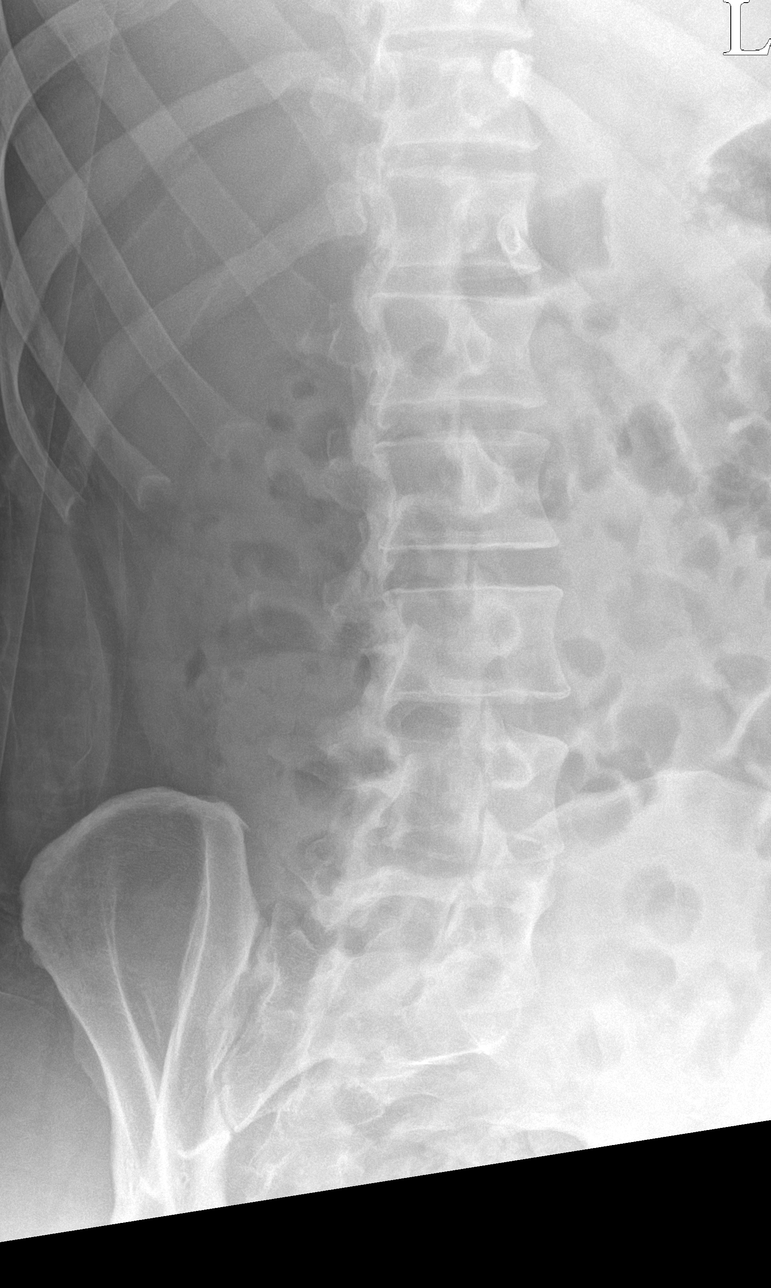

[l-spine lateral]
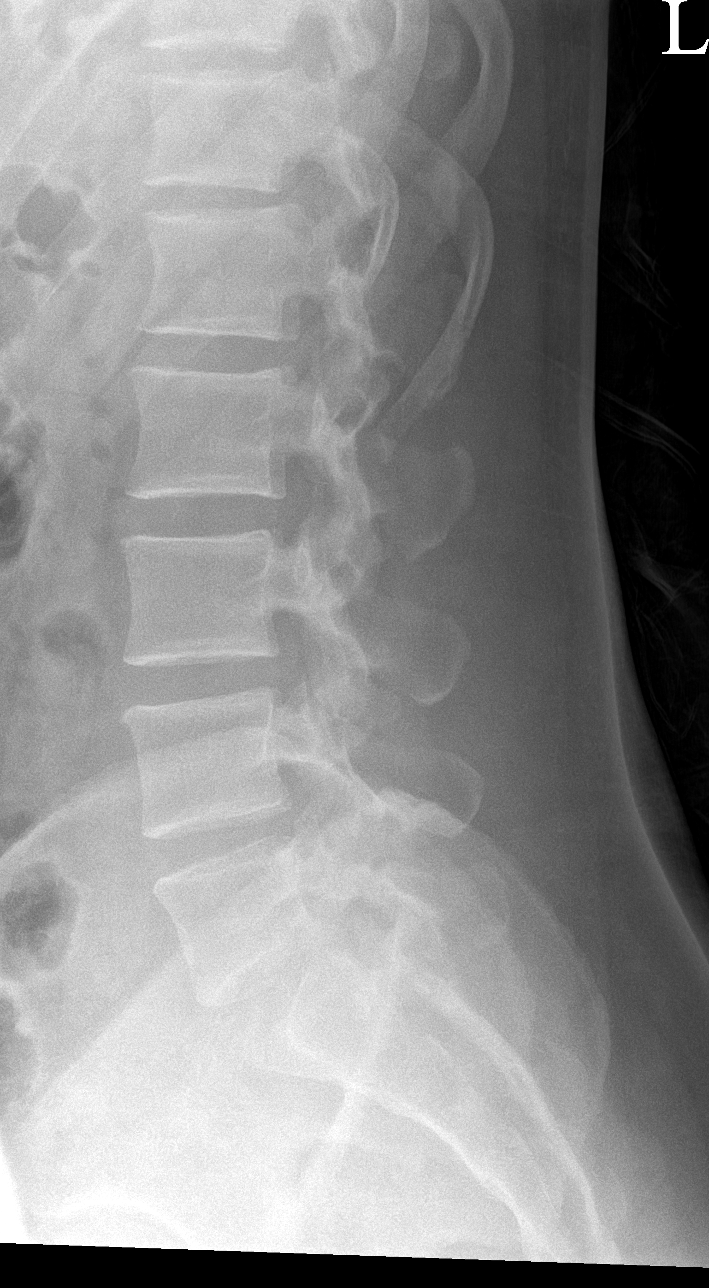

[l-spine spot]
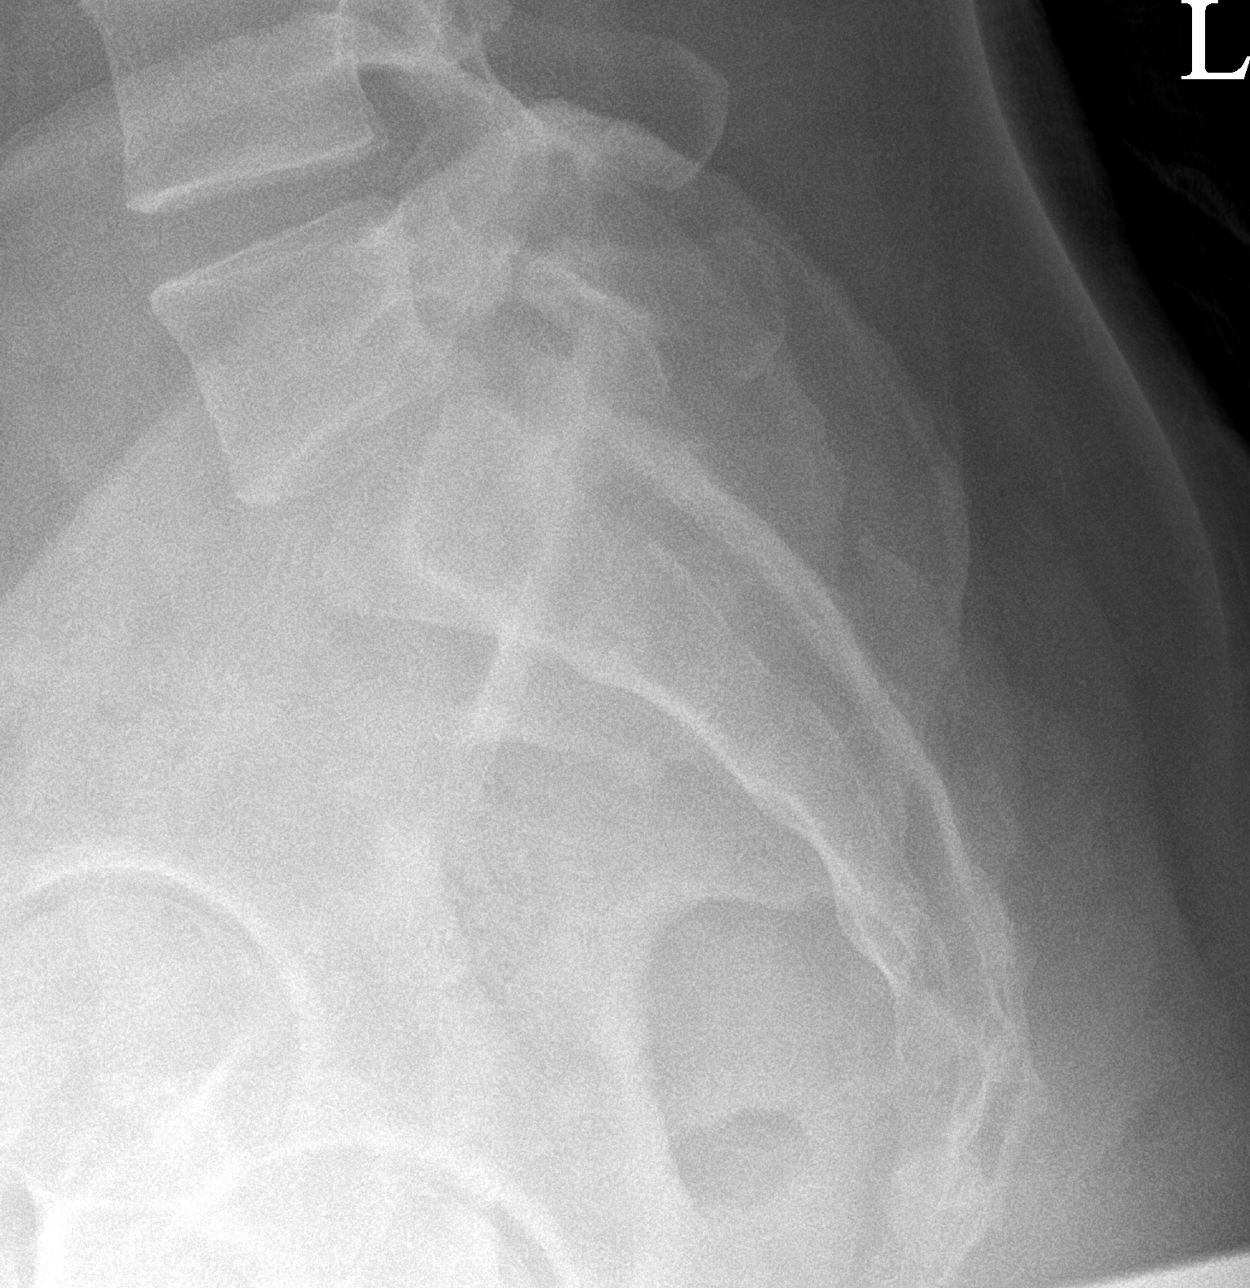

[5 of 5 positions shown; findings below may reference images not displayed]

FINDINGS: There are 5 non-rib-bearing lumbar vertebra. No acute or prior
fracture. Normal alignment. Normal vertebral body heights.
Intervertebral disc spaces are preserved. There are bilateral L5
pars interarticularis defects, which are better appreciated on
remote prior abdominopelvic CT. There may be minimal L4-L5 facet
hypertrophy.
IMPRESSION: 1. No evidence of lumbar spine fracture.
2. Mild L4-L5 facet hypertrophy.
3. Chronic bilateral L5 pars interarticularis defects, better
appreciated on prior CT.

## 2023-10-07 ENCOUNTER — Other Ambulatory Visit (HOSPITAL_COMMUNITY): Payer: Self-pay

## 2023-10-22 ENCOUNTER — Telehealth (INDEPENDENT_AMBULATORY_CARE_PROVIDER_SITE_OTHER): Admitting: Medical

## 2023-10-22 DIAGNOSIS — R0981 Nasal congestion: Secondary | ICD-10-CM | POA: Diagnosis not present

## 2023-10-22 DIAGNOSIS — J320 Chronic maxillary sinusitis: Secondary | ICD-10-CM

## 2023-10-22 DIAGNOSIS — R067 Sneezing: Secondary | ICD-10-CM | POA: Diagnosis not present

## 2023-10-22 DIAGNOSIS — J3489 Other specified disorders of nose and nasal sinuses: Secondary | ICD-10-CM

## 2023-10-22 MED ORDER — FLUTICASONE PROPIONATE 50 MCG/ACT NA SUSP
2.0000 | Freq: Every day | NASAL | 1 refills | Status: DC
Start: 2023-10-22 — End: 2023-12-26

## 2023-10-22 MED ORDER — AMOXICILLIN-POT CLAVULANATE 875-125 MG PO TABS: 1.0000 | ORAL_TABLET | Freq: Two times a day (BID) | ORAL | 0 refills | Status: DC

## 2023-10-22 NOTE — Patient Instructions (Addendum)
 Assessment and Plan: Assessment and Plan    Sinusitis with possible allergic component Symptoms suggest sinusitis with potential allergic component. Informed consent for Augmentin  and Flonase . Discussed Goody powders' ulcer risk/not to use aspirin. Advised to report persistent symptoms or cough. - Prescribe Augmentin  875 mg twice daily for 10 days. - Prescribe Flonase  nasal spray. - Advise Tylenol  for headache relief. - Advise against Goody powders due to ulcer risk. - If BP <140/90, advise ibuprofen  200-400 mg with low dose Tylenol  for headache. - Instruct to report cough before weekend for possible cough tablet. - Advise in-office evaluation if symptoms persist beyond early next week.  Video visit and explained to check bp and pulse at home and notify me of readings by my chart. Blood pressure control discussed. Advised against ibuprofen  if BP unknown or >140/90. - Request BP check by Orelia Binet his wife, including pulse and oxygen saturation. - Advise against ibuprofen  if BP unknown or >140/90.   Follow up 7 days or sooner if needed

## 2023-10-22 NOTE — Progress Notes (Signed)
 Virtual Visit via Video Note  I connected with Mitchell [name] on 10/22/23 at 10:00 AM EDT by a video enabled telemedicine application and verified that I am speaking with the correct person using two identifiers.  Location: Patient: home [place] Provider: office [place]   I discussed the limitations of evaluation and management by telemedicine and the availability of in person appointments. The patient expressed understanding and agreed to proceed.  History of Present Illness: Discussed the use of AI scribe software for clinical note transcription with the patient, who gave verbal consent to proceed.    History of Present Illness   Mitchell [name] is a 38 year old male who presents with sinus pressure and nasal congestion.  He has been experiencing sinus pressure, sneezing, and nasal congestion for about a week. Each sneeze triggers a headache, and he experiences a sore throat upon waking. The nasal discharge is green, and there is pain in the frontal and maxillary sinuses. No cough is present, but he frequently blows his nose. He reports sweating but denies chills.  His symptoms worsen when he goes outside, and he suspects he might have allergies, although he has not been treated for them before. He returned from a trip last Friday and plans to stay in town for another week. 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  No ear pain and he has not felt the need to set up a doctor's appointment for similar symptoms in the past. His blood pressures have been controlled in the past.         Observations/Objective: General-no acute distress, pleasant, oriented. Lungs- on inspection lungs appear unlabored. Neck- no tracheal deviation or jvd on inspection. Neuro- gross motor function appears intact. Heent- on self palpation maxillary and frontal sinus pressure.  Assessment and Plan: Assessment and Plan    Patient  Instructions  Assessment and Plan: Assessment and Plan    Sinusitis with possible allergic component Symptoms suggest sinusitis with potential allergic component. Informed consent for Augmentin  and Flonase . Discussed Goody powders' ulcer risk/not to use aspirin. Advised to report persistent symptoms or cough. - Prescribe Augmentin  875 mg twice daily for 10 days. - Prescribe Flonase  nasal spray. - Advise Tylenol  for headache relief. - Advise against Goody powders due to ulcer risk. - If BP <140/90, advise ibuprofen  200-400 mg with low dose Tylenol  for headache. - Instruct to report cough before weekend for possible cough tablet. - Advise in-office evaluation if symptoms persist beyond early next week.  Video visit and explained to check bp and pulse at home and notify me of readings by my chart. Blood pressure control discussed. Advised against ibuprofen  if BP unknown or >140/90. - Request BP check by Orelia Binet his wife, including pulse and oxygen saturation. - Advise against ibuprofen  if BP unknown or >140/90.   Follow up 7 days or sooner if needed       Sylvia Everts, PA-C    Follow Up Instructions:    I discussed the assessment and treatment plan with the patient. The patient was provided an opportunity to ask questions and all were answered. The patient agreed with the plan and demonstrated an understanding of the instructions.   The patient was advised to call back or seek an in-person evaluation if the symptoms worsen or if the condition fails to improve as anticipated.   Kida Digiulio, PA-C

## 2023-10-27 ENCOUNTER — Other Ambulatory Visit (HOSPITAL_BASED_OUTPATIENT_CLINIC_OR_DEPARTMENT_OTHER): Payer: Self-pay

## 2023-10-27 ENCOUNTER — Encounter: Payer: Self-pay | Admitting: Gastroenterology

## 2023-10-27 ENCOUNTER — Ambulatory Visit (INDEPENDENT_AMBULATORY_CARE_PROVIDER_SITE_OTHER): Admitting: Gastroenterology

## 2023-10-27 ENCOUNTER — Ambulatory Visit: Payer: Self-pay | Admitting: Gastroenterology

## 2023-10-27 ENCOUNTER — Other Ambulatory Visit (INDEPENDENT_AMBULATORY_CARE_PROVIDER_SITE_OTHER)

## 2023-10-27 VITALS — BP 130/88 | HR 96 | Ht 70.5 in | Wt 213.1 lb

## 2023-10-27 DIAGNOSIS — R1013 Epigastric pain: Secondary | ICD-10-CM | POA: Diagnosis not present

## 2023-10-27 DIAGNOSIS — R630 Anorexia: Secondary | ICD-10-CM

## 2023-10-27 DIAGNOSIS — R634 Abnormal weight loss: Secondary | ICD-10-CM

## 2023-10-27 DIAGNOSIS — R748 Abnormal levels of other serum enzymes: Secondary | ICD-10-CM | POA: Diagnosis not present

## 2023-10-27 DIAGNOSIS — E538 Deficiency of other specified B group vitamins: Secondary | ICD-10-CM

## 2023-10-27 DIAGNOSIS — R7989 Other specified abnormal findings of blood chemistry: Secondary | ICD-10-CM

## 2023-10-27 DIAGNOSIS — R161 Splenomegaly, not elsewhere classified: Secondary | ICD-10-CM

## 2023-10-27 DIAGNOSIS — K921 Melena: Secondary | ICD-10-CM

## 2023-10-27 LAB — CBC WITH DIFFERENTIAL/PLATELET
Basophils Absolute: 0.1 10*3/uL (ref 0.0–0.1)
Basophils Relative: 0.6 % (ref 0.0–3.0)
Eosinophils Absolute: 0.1 10*3/uL (ref 0.0–0.7)
Eosinophils Relative: 1.6 % (ref 0.0–5.0)
HCT: 42 % (ref 39.0–52.0)
Hemoglobin: 14.3 g/dL (ref 13.0–17.0)
Lymphocytes Relative: 41.1 % (ref 12.0–46.0)
Lymphs Abs: 3.5 10*3/uL (ref 0.7–4.0)
MCHC: 34.1 g/dL (ref 30.0–36.0)
MCV: 86.7 fl (ref 78.0–100.0)
Monocytes Absolute: 0.5 10*3/uL (ref 0.1–1.0)
Monocytes Relative: 5.6 % (ref 3.0–12.0)
Neutro Abs: 4.4 10*3/uL (ref 1.4–7.7)
Neutrophils Relative %: 51.1 % (ref 43.0–77.0)
Platelets: 205 10*3/uL (ref 150.0–400.0)
RBC: 4.84 Mil/uL (ref 4.22–5.81)
RDW: 14.3 % (ref 11.5–15.5)
WBC: 8.6 10*3/uL (ref 4.0–10.5)

## 2023-10-27 LAB — COMPREHENSIVE METABOLIC PANEL WITH GFR
ALT: 15 U/L (ref 0–53)
AST: 19 U/L (ref 0–37)
Albumin: 4.6 g/dL (ref 3.5–5.2)
Alkaline Phosphatase: 96 U/L (ref 39–117)
BUN: 18 mg/dL (ref 6–23)
CO2: 27 meq/L (ref 19–32)
Calcium: 9.6 mg/dL (ref 8.4–10.5)
Chloride: 103 meq/L (ref 96–112)
Creatinine, Ser: 0.89 mg/dL (ref 0.40–1.50)
GFR: 108.14 mL/min (ref >60.00)
Glucose, Bld: 94 mg/dL (ref 70–99)
Potassium: 4.2 meq/L (ref 3.5–5.1)
Sodium: 139 meq/L (ref 135–145)
Total Bilirubin: 0.7 mg/dL (ref 0.2–1.2)
Total Protein: 7.7 g/dL (ref 6.0–8.3)

## 2023-10-27 MED ORDER — OMEPRAZOLE 20 MG PO CPDR
20.0000 mg | DELAYED_RELEASE_CAPSULE | Freq: Every day | ORAL | 3 refills | Status: DC
  Filled 2023-10-27: qty 90, 90d supply, fill #0

## 2023-10-27 NOTE — Progress Notes (Signed)
 Chief Complaint:generalized abd pain Primary GI Doctor: Dr. Venice Gillis  HPI:  Patient is a 38 year old male patient with past medical history of bipolar, anxiety, depression, and hyperlipidemia, who was referred to me by Aida House, MD on 04/09/23 for a complaint of generalized abd pain.    07/16/23 seen in ED for concerns of elevated liver enzymes. CBC notable for very mild anemia, hemoglobin 12.3, CMP notable for mildly elevated liver enzymes, AST 65, ALT 58, HIV is nonreactive, troponin normal x 1 in context of note chest pain, monoscreen negative, lipase normal, PT/INR within normal range, hepatitis panel pending, D-dimer shows 2.7. CT PE study-  Negative for acute pulmonary embolus or aortic dissection. Subsegmental atelectasis at the bases without focal airspace disease.Borderline to mild splenomegaly.  07/28/23 seen by Onc/Hem for elevated ferritin and fatigue. He had additional lab work and imaging. He was found to have cytomegalovirus. Imaging showed mild splenomegaly. CBC showed a Hgb of 12.3, elevated LFTS (AST 65/ ALT 58), Sed rate of 37. Hep C negative, HIV Negative. Acute hepatitis Negative. It was thought that his elevated ferritin was due to an acute phase reactant. He also had a B12 level drawn by his PCP on 07/14/2023 that was low at 250.  Labs ordered : HFE gene mutation- negative   Interval History    Patient presents with main complaint of chronic upper abdominal pain and weight loss.  Patient states he has had this for several years and describes it as a aching type pain.  Patient states it can occur several times a week.  He cannot pinpoint any known triggers.  Can happen with or without food.  Patient states he has regular bowel movement every other day.  He does note he has had dark stools in the past but has been a few months.  He has not been checking regularly but states when he went yesterday it was green.     Patient denies GERD or dysphagia. Patient denies nausea or  vomiting.  Patient admits to poor appetite. He has lost 12lbs since March.   No new medications.Patient admits the only medication he is taking is medication for sleep. He is not taking any other prescribed or OTC medications.    He was using OTC ibuprofen , he states he took significant amount in Feb- March but was told to discontinue.  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  He states the amount varies.  Smokes tobacco a pack a day.  Never had EGD/colon.   In terms of family history he believes that his paternal cousin and uncle both had/have liver disease though he does report that they drink heavily. No known hemochromatosis.   Patient states he has a follow-up appointment with hematology next week. It was noted he had b 12 deficiency and placed on b 12 supplement, he tells me today he has not been taking it. Admits to fatigue.  Wt Readings from Last 3 Encounters:  10/27/23 213 lb 2 oz (96.7 kg)  09/01/23 213 lb (96.6 kg)  07/28/23 225 lb 6.4 oz (102.2 kg)    Past Medical History:  Diagnosis Date   Anemia    Appendicitis 2015   Bowel obstruction (HCC)    Glaucoma    H/O left inguinal hernia repair    Moderate alcohol use disorder in controlled environment (HCC)    Tobacco dependence     Past Surgical History:  Procedure Laterality Date   HERNIA REPAIR  I & D EXTREMITY Right 08/29/2016   Procedure: IRRIGATION AND DEBRIDEMENT OF RIGHT HAND;  Surgeon: Mauricia South, MD;  Location: [facility] OR;  Service: Plastics;  Laterality: Right;   LAPAROSCOPIC APPENDECTOMY N/A 08/13/2013   Procedure: APPENDECTOMY LAPAROSCOPIC;  Surgeon: Cloyce Darby, MD;  Location: [facility] OR;  Service: General;  Laterality: N/A;   LAPAROTOMY N/A 08/16/2013   Procedure: INCARCERATED ABDOMINAL WALL HERNIA REPAIR;  Surgeon: Diantha Fossa, MD;  Location: [facility] OR;  Service: General;  Laterality: N/A;    Current Outpatient Medications  Medication Sig Dispense Refill   omeprazole  (PRILOSEC) 20 MG  capsule Take 1 capsule (20 mg total) by mouth daily. 90 capsule 3   amoxicillin -clavulanate (AUGMENTIN ) 875-125 MG tablet Take 1 tablet by mouth 2 (two) times daily. (Patient not taking: Reported on 10/27/2023) 20 tablet 0   ARIPiprazole  (ABILIFY ) 5 MG tablet Take 1 tablet (5 mg total) by mouth at bedtime. 30 tablet 2   famotidine  (PEPCID ) 20 MG tablet Take 1 tablet (20 mg total) by mouth 2 (two) times daily. (Patient not taking: Reported on 10/27/2023) 60 tablet 2   fluticasone  (FLONASE ) 50 MCG/ACT nasal spray Place 2 sprays into both nostrils daily. 16 g 1   hydrOXYzine  (VISTARIL ) 25 MG capsule Take 1 capsule (25 mg total) by mouth every 8 (eight) hours as needed for anxiety. 30 capsule 2   meloxicam  (MOBIC ) 15 MG tablet Take 1 tablet (15 mg total) by mouth daily. 90 tablet 1   ondansetron  (ZOFRAN ) 4 MG tablet Take 1 tablet (4 mg total) by mouth every 8 (eight) hours as needed for nausea or vomiting. 30 tablet 1   No current facility-administered medications for this visit.    Allergies as of 10/27/2023   (No Known Allergies)    Family History  Problem Relation Age of Onset   Migraines Mother    Colon polyps Mother    COPD Father    Gout Father    Diabetes Maternal Grandmother     Review of Systems:    Constitutional: No weight loss, fever, chills, weakness or fatigue HEENT: Eyes: No change in vision               Ears, Nose, Throat:  No change in hearing or congestion Skin: No rash or itching Cardiovascular: No chest pain, chest pressure or palpitations   Respiratory: No SOB or cough Gastrointestinal: See HPI and otherwise negative Genitourinary: No dysuria or change in urinary frequency Neurological: No headache, dizziness or syncope Musculoskeletal: No new muscle or joint pain Hematologic: No bleeding or bruising Psychiatric: No history of depression or anxiety    Physical Exam:  Vital signs: BP 130/88 (BP Location: Left Arm, Patient Position: Sitting, Cuff Size: Large)    Pulse 96   Ht 5' 10.5 (1.791 m) Comment: height measured without shoes  Wt 213 lb 2 oz (96.7 kg)   BMI 30.15 kg/m   Constitutional:  Pleasant A.A male appears to be in NAD, Well developed, Well nourished, alert and cooperative Throat: Oral cavity and pharynx without inflammation, swelling or lesion.  Respiratory: Respirations even and unlabored. Lungs clear to auscultation bilaterally.   No wheezes, crackles, or rhonchi.  Cardiovascular: Normal S1, S2. Regular rate and rhythm. No peripheral edema, cyanosis or pallor.  Gastrointestinal:  Soft, nondistended, nontender. No rebound or guarding. Normal bowel sounds. No appreciable masses or hepatomegaly. Rectal:  Not performed.  Msk:  Symmetrical without gross deformities. Without edema, no deformity or joint abnormality.  Neurologic:  Alert and  oriented x4;  grossly normal neurologically.  Skin:   Dry and intact without significant lesions or rashes. Psychiatric: Oriented to person, place and time. Demonstrates good judgement and reason without abnormal affect or behaviors.  RELEVANT LABS AND IMAGING: CBC    Latest Ref Rng & Units 09/01/2023    2:14 PM 07/28/2023    1:58 PM 07/16/2023   12:59 PM  CBC  WBC 4.0 - 10.5 K/uL 7.0  7.5  9.8   Hemoglobin 13.0 - 17.0 g/dL 82.9  56.2  13.0   Hematocrit 39.0 - 52.0 % 42.2  36.6  35.7   Platelets 150 - 400 K/uL 190  210  199      CMP     Latest Ref Rng & Units 09/01/2023    2:06 PM 07/28/2023    1:58 PM 07/16/2023   12:59 PM  CMP  Glucose 70 - 99 mg/dL 865  91  95   BUN 6 - 20 mg/dL 9  6  12    Creatinine 0.61 - 1.24 mg/dL 7.84  6.96  2.95   Sodium 135 - 145 mmol/L 141  136  135   Potassium 3.5 - 5.1 mmol/L 3.6  4.0  4.0   Chloride 98 - 111 mmol/L 101  99  99   CO2 22 - 32 mmol/L 25  26  28    Calcium 8.9 - 10.3 mg/dL 9.7  9.7  8.8   Total Protein 6.5 - 8.1 g/dL 7.2  7.9  7.3   Total Bilirubin 0.0 - 1.2 mg/dL 0.6  0.6  0.9   Alkaline Phos 38 - 126 U/L 83  90  86   AST 15 - 41 U/L 61  47   65   ALT 0 - 44 U/L 23  36  58      Lab Results  Component Value Date   TSH 0.51 07/14/2023   This SmartLink has not been configured with any valid records.   Lab Results  Component Value Date   IRON 61 09/01/2023   TIBC 351 09/01/2023   FERRITIN 162 09/01/2023   09/01/23 labs show: b 12 - 191, LDH 271, AST 61, ALT 23, alk phosp 83, total bili 0.6 07/25/23 labs show: Sed rate 37, ANA - negative, RF factor- 12, CMV positive 07/16/23 labs show: PT/INR-  14/1.1,  HIV non reactive, Hepatitis panel non reactive, lipase 20  Assessment: Encounter Diagnoses  Name Primary?   Loss of weight Yes   Poor appetite    Melena    Abdominal pain, epigastric    Elevated liver enzymes    Vitamin B 12 deficiency    Splenomegaly   38 year old male patient who presents for chronic upper abdominal pain and weight loss.  Patient denies pain is triggered by eating.  He has been prescribed antiacids in the past but admits he has never taken anything scheduled.  Patient denies any GERD or dysphagia.  Patient does admit to using NSAIDs when he was sick back in February March along with dark stools. Will start him on Omeprazole  20mg  po daily and GERD diet in the case of NUD. He has lost over 15lbs with poor appetite. Will schedule upper GI endoscopy in [facility] with Dr. Venice Gillis to r/o gastritis, PUD, and/or h pylori.  At that time he did have mild anemia with hemoglobin 12.3. Most recent is 14.5.  It is also noted the patient has B12 deficiency and not currently taking vitamin supplementation.  Patient states he has had the  abdominal pain prior to getting sick in March where he was found to have CMV.  At that time he it was also noted he had elevated LFTs and splenomegaly on CT angio.  Patient does drink a few times a month which sometimes can include multiple drinks.  Recommended patient refrain from alcohol use.  Will recheck lab work to see if his liver enzymes have normalized will is complete ultrasound to evaluate liver  and spleen.  If abnormal findings we will proceed with full liver workup.   ANA negative.  PLT 190 (07/14/23) AST 73>65>47>61 (07/14/23) ALT 67>58>36>23 Total Bili normal, Alk phosp normal Hemachromatosis gene negative Hepatitis panel negative   Plan: - Order US  Abd complete -Recheck CBC, CMP today, if still elevated proceed with full liver workup -Educated on Alcohol cessation -Recommended patient take b12 supplementation as prescribed -follow-up with Hem/Oncology as scheduled -Start omeprazole  20 mg po daily -GERD diet, no late meals 3-4 hours before lying down. -Schedule EGD in [facility] with Dr. Venice Gillis. The risks and benefits of EGD with possible biopsies and esophageal dilation were discussed with the patient who agrees to proceed.   Thank you for the courtesy of this consult. Please call me with any questions or concerns.   Adahlia Stembridge, FNP-C [facility] 10/27/2023, 12:43 PM  Cc: Aida House, MD

## 2023-10-27 NOTE — Patient Instructions (Addendum)
 Recommend GERD diet, no late meals  Start Omeprazole  20 mg po daily 1 tablet 30-45 minutes before breakfast Alcohol cessation Avoid NSAID's (ibuprofen )   Your provider has requested that you go to the basement level for lab work before leaving today. Press B on the elevator. The lab is located at the first door on the left as you exit the elevator.  You have been scheduled for an abdominal ultrasound at [facility] (1st floor of hospital) on 11/05/23 at 8:30am. Please arrive 30 minutes prior to your appointment for registration. Make certain not to have anything to eat or drink after midnight prior to your appointment. Should you need to reschedule your appointment, please contact radiology at [phone]. This test typically takes about 30 minutes to perform.   _______________________________________________________  If your blood pressure at your visit was 140/90 or greater, please contact your primary care physician to follow up on this.  _______________________________________________________  If you are age 11 or older, your body mass index should be between 23-30. Your Body mass index is 30.15 kg/m. If this is out of the aforementioned range listed, please consider follow up with your Primary Care Provider.  If you are age 31 or younger, your body mass index should be between 19-25. Your Body mass index is 30.15 kg/m. If this is out of the aformentioned range listed, please consider follow up with your Primary Care Provider.   ________________________________________________________  The [facility] providers would like to encourage you to use MYCHART to communicate with providers for non-urgent requests or questions.  Due to long hold times on the telephone, sending your provider a message by [facility] may be a faster and more efficient way to get a response.  Please allow 48 business hours for a response.  Please remember that this is for non-urgent requests.   _______________________________________________________   Thank you for trusting me with your gastrointestinal care. Deanna May, RNP

## 2023-10-31 ENCOUNTER — Telehealth: Payer: Self-pay | Admitting: Gastroenterology

## 2023-10-31 DIAGNOSIS — K921 Melena: Secondary | ICD-10-CM

## 2023-10-31 DIAGNOSIS — R1013 Epigastric pain: Secondary | ICD-10-CM

## 2023-10-31 DIAGNOSIS — R748 Abnormal levels of other serum enzymes: Secondary | ICD-10-CM

## 2023-10-31 DIAGNOSIS — R630 Anorexia: Secondary | ICD-10-CM

## 2023-10-31 DIAGNOSIS — R634 Abnormal weight loss: Secondary | ICD-10-CM

## 2023-10-31 DIAGNOSIS — E538 Deficiency of other specified B group vitamins: Secondary | ICD-10-CM

## 2023-10-31 NOTE — Telephone Encounter (Signed)
 Inbound call from patient spouse calling to schedule EGD for patient.   Requesting f/u call at [phone]. Please advise.   Thank you

## 2023-11-03 ENCOUNTER — Ambulatory Visit: Admitting: Medical Oncology

## 2023-11-03 ENCOUNTER — Inpatient Hospital Stay

## 2023-11-05 ENCOUNTER — Ambulatory Visit (HOSPITAL_COMMUNITY)

## 2023-11-06 ENCOUNTER — Other Ambulatory Visit (HOSPITAL_BASED_OUTPATIENT_CLINIC_OR_DEPARTMENT_OTHER): Payer: Self-pay

## 2023-11-07 NOTE — Telephone Encounter (Signed)
 Patient Spouse is calling again to find out if she is able to schedule her Husband EGD procedure. Patient spouse Is requesting a call back [phone] . Please advise

## 2023-11-07 NOTE — Telephone Encounter (Signed)
 Spoke with patient's wife and scheduled EGD for 11/13/23 @ 1 pm. Mychart instructions sent.

## 2023-11-13 ENCOUNTER — Encounter: Payer: Self-pay | Admitting: Gastroenterology

## 2023-11-13 ENCOUNTER — Ambulatory Visit: Admitting: Gastroenterology

## 2023-11-13 ENCOUNTER — Other Ambulatory Visit (HOSPITAL_BASED_OUTPATIENT_CLINIC_OR_DEPARTMENT_OTHER): Payer: Self-pay

## 2023-11-13 VITALS — BP 130/80 | HR 73 | Temp 98.4°F | Resp 18 | Ht 70.5 in | Wt 216.4 lb

## 2023-11-13 DIAGNOSIS — K2951 Unspecified chronic gastritis with bleeding: Secondary | ICD-10-CM | POA: Diagnosis not present

## 2023-11-13 DIAGNOSIS — K3189 Other diseases of stomach and duodenum: Secondary | ICD-10-CM

## 2023-11-13 DIAGNOSIS — R634 Abnormal weight loss: Secondary | ICD-10-CM | POA: Diagnosis not present

## 2023-11-13 DIAGNOSIS — K2970 Gastritis, unspecified, without bleeding: Secondary | ICD-10-CM

## 2023-11-13 DIAGNOSIS — K921 Melena: Secondary | ICD-10-CM | POA: Diagnosis not present

## 2023-11-13 DIAGNOSIS — K2950 Unspecified chronic gastritis without bleeding: Secondary | ICD-10-CM | POA: Diagnosis not present

## 2023-11-13 MED ORDER — OMEPRAZOLE 20 MG PO CPDR
20.0000 mg | DELAYED_RELEASE_CAPSULE | Freq: Every day | ORAL | 4 refills | Status: AC
  Filled 2023-11-13: qty 90, 90d supply, fill #0

## 2023-11-13 MED ORDER — SODIUM CHLORIDE 0.9 % IV SOLN: 500.0000 mL | Freq: Once | INTRAVENOUS | Status: DC

## 2023-11-13 NOTE — Progress Notes (Signed)
 Called to room to assist during endoscopic procedure.  Patient ID and intended procedure confirmed with present staff. Received instructions for my participation in the procedure from the performing physician.

## 2023-11-13 NOTE — Patient Instructions (Addendum)
 Resume previous diet Continue present medications, including omeprazole  20 mg daily  Await pathology results No NSAIDS (Non-Steroidal anti-inflammatory drugs).  (These include, aspirin, aspirin-containing products, ibuprofen , advil , motrin , naproxen, aleve, goody powders, etc) Tylenol  is ok to take as needed, see label for instructions.  Handouts/information given for gastritis  YOU HAD AN ENDOSCOPIC PROCEDURE TODAY AT THE [facility]:   Refer to the procedure report that was given to you for any specific questions about what was found during the examination.  If the procedure report does not answer your questions, please call your gastroenterologist to clarify.  If you requested that your care partner not be given the details of your procedure findings, then the procedure report has been included in a sealed envelope for you to review at your convenience later.  YOU SHOULD EXPECT: Some feelings of bloating in the abdomen. Passage of more gas than usual.  Walking can help get rid of the air that was put into your GI tract during the procedure and reduce the bloating. If you had a lower endoscopy (such as a colonoscopy or flexible sigmoidoscopy) you may notice spotting of blood in your stool or on the toilet paper. If you underwent a bowel prep for your procedure, you may not have a normal bowel movement for a few days.  Please Note:  You might notice some irritation and congestion in your nose or some drainage.  This is from the oxygen used during your procedure.  There is no need for concern and it should clear up in a day or so.  SYMPTOMS TO REPORT IMMEDIATELY:  Following upper endoscopy (EGD)  Vomiting of blood or coffee ground material  New chest pain or pain under the shoulder blades  Painful or persistently difficult swallowing  New shortness of breath  Fever of 100F or higher  Black, tarry-looking stools For urgent or emergent issues, a gastroenterologist can be reached at  any hour by calling [phone]. Do not use MyChart messaging for urgent concerns.   DIET:  We do recommend a small meal at first, but then you may proceed to your regular diet.  Drink plenty of fluids but you should avoid alcoholic beverages for 24 hours.  ACTIVITY:  You should plan to take it easy for the rest of today and you should NOT DRIVE or use heavy machinery until tomorrow (because of the sedation medicines used during the test).    FOLLOW UP: Our staff will call the number listed on your records the next business day following your procedure.  We will call around 7:15- 8:00 am to check on you and address any questions or concerns that you may have regarding the information given to you following your procedure. If we do not reach you, we will leave a message.     If any biopsies were taken you will be contacted by phone or by letter within the next 1-3 weeks.  Please call us  at [phone] if you have not heard about the biopsies in 3 weeks.    SIGNATURES/CONFIDENTIALITY: You and/or your care partner have signed paperwork which will be entered into your electronic medical record.  These signatures attest to the fact that that the information above on your After Visit Summary has been reviewed and is understood.  Full responsibility of the confidentiality of this discharge information lies with you and/or your care-partner.

## 2023-11-13 NOTE — Progress Notes (Signed)
 Chief Complaint:generalized abd pain Primary GI Doctor: Dr. Charlanne   HPI:  Patient is a 38 year old male patient with past medical history of bipolar, anxiety, depression, and hyperlipidemia, who was referred to me by Ozell Heron HERO, MD on 04/09/23 for a complaint of generalized abd pain.     07/16/23 seen in ED for concerns of elevated liver enzymes. CBC notable for very mild anemia, hemoglobin 12.3, CMP notable for mildly elevated liver enzymes, AST 65, ALT 58, HIV is nonreactive, troponin normal x 1 in context of note chest pain, monoscreen negative, lipase normal, PT/INR within normal range, hepatitis panel pending, D-dimer shows 2.7. CT PE study-  Negative for acute pulmonary embolus or aortic dissection. Subsegmental atelectasis at the bases without focal airspace disease.Borderline to mild splenomegaly.   07/28/23 seen by Onc/Hem for elevated ferritin and fatigue. He had additional lab work and imaging. He was found to have cytomegalovirus. Imaging showed mild splenomegaly. CBC showed a Hgb of 12.3, elevated LFTS (AST 65/ ALT 58), Sed rate of 37. Hep C negative, HIV Negative. Acute hepatitis Negative. It was thought that his elevated ferritin was due to an acute phase reactant. He also had a B12 level drawn by his PCP on 07/14/2023 that was low at 250.  Labs ordered : HFE gene mutation- negative    Interval History    Patient presents with main complaint of chronic upper abdominal pain and weight loss.  Patient states he has had this for several years and describes it as a aching type pain.  Patient states it can occur several times a week.  He cannot pinpoint any known triggers.  Can happen with or without food.  Patient states he has regular bowel movement every other day.  He does note he has had dark stools in the past but has been a few months.  He has not been checking regularly but states when he went yesterday it was green.     Patient denies GERD or dysphagia. Patient denies nausea  or vomiting.  Patient admits to poor appetite. He has lost 12lbs since March.    No new medications.Patient admits the only medication he is taking is medication for sleep. He is not taking any other prescribed or OTC medications.    He was using OTC ibuprofen , he states he took significant amount in Feb- March but was told to discontinue.   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  He states the amount varies.  Smokes tobacco a pack a day.   Never had EGD/colon.    In terms of family history he believes that his paternal cousin and uncle both had/have liver disease though he does report that they drink heavily. No known hemochromatosis.    Patient states he has a follow-up appointment with hematology next week. It was noted he had b 12 deficiency and placed on b 12 supplement, he tells me today he has not been taking it. Admits to fatigue.      Wt Readings from Last 3 Encounters:  10/27/23 213 lb 2 oz (96.7 kg)  09/01/23 213 lb (96.6 kg)  07/28/23 225 lb 6.4 oz (102.2 kg)        Past Medical History:  Diagnosis Date   Anemia     Appendicitis 2015   Bowel obstruction (HCC)     Glaucoma     H/O left inguinal hernia repair     Moderate alcohol use disorder in controlled environment (  HCC)     Tobacco dependence                 Past Surgical History:  Procedure Laterality Date   HERNIA REPAIR       I & D EXTREMITY Right 08/29/2016    Procedure: IRRIGATION AND DEBRIDEMENT OF RIGHT HAND;  Surgeon: Balinda Rogue, MD;  Location: [facility] OR;  Service: Plastics;  Laterality: Right;   LAPAROSCOPIC APPENDECTOMY N/A 08/13/2013    Procedure: APPENDECTOMY LAPAROSCOPIC;  Surgeon: Dann FORBES Hummer, MD;  Location: [facility] OR;  Service: General;  Laterality: N/A;   LAPAROTOMY N/A 08/16/2013    Procedure: INCARCERATED ABDOMINAL WALL HERNIA REPAIR;  Surgeon: Lynwood MALVA Pina, MD;  Location: [facility] OR;  Service: General;  Laterality: N/A;                Current Outpatient Medications   Medication Sig Dispense Refill   omeprazole  (PRILOSEC) 20 MG capsule Take 1 capsule (20 mg total) by mouth daily. 90 capsule 3   amoxicillin -clavulanate (AUGMENTIN ) 875-125 MG tablet Take 1 tablet by mouth 2 (two) times daily. (Patient not taking: Reported on 10/27/2023) 20 tablet 0   ARIPiprazole  (ABILIFY ) 5 MG tablet Take 1 tablet (5 mg total) by mouth at bedtime. 30 tablet 2   famotidine  (PEPCID ) 20 MG tablet Take 1 tablet (20 mg total) by mouth 2 (two) times daily. (Patient not taking: Reported on 10/27/2023) 60 tablet 2   fluticasone  (FLONASE ) 50 MCG/ACT nasal spray Place 2 sprays into both nostrils daily. 16 g 1   hydrOXYzine  (VISTARIL ) 25 MG capsule Take 1 capsule (25 mg total) by mouth every 8 (eight) hours as needed for anxiety. 30 capsule 2   meloxicam  (MOBIC ) 15 MG tablet Take 1 tablet (15 mg total) by mouth daily. 90 tablet 1   ondansetron  (ZOFRAN ) 4 MG tablet Take 1 tablet (4 mg total) by mouth every 8 (eight) hours as needed for nausea or vomiting. 30 tablet 1      No current facility-administered medications for this visit.           Allergies as of 10/27/2023   (No Known Allergies)           Family History  Problem Relation Age of Onset   Migraines Mother     Colon polyps Mother     COPD Father     Gout Father     Diabetes Maternal Grandmother            Review of Systems:    Constitutional: No weight loss, fever, chills, weakness or fatigue HEENT: Eyes: No change in vision               Ears, Nose, Throat:  No change in hearing or congestion Skin: No rash or itching Cardiovascular: No chest pain, chest pressure or palpitations   Respiratory: No SOB or cough Gastrointestinal: See HPI and otherwise negative Genitourinary: No dysuria or change in urinary frequency Neurological: No headache, dizziness or syncope Musculoskeletal: No new muscle or joint pain Hematologic: No bleeding or bruising Psychiatric: No history of depression or anxiety      Physical  Exam:  Vital signs: BP 130/88 (BP Location: Left Arm, Patient Position: Sitting, Cuff Size: Large)   Pulse 96   Ht 5' 10.5 (1.791 m) Comment: height measured without shoes  Wt 213 lb 2 oz (96.7 kg)   BMI 30.15 kg/m    Constitutional:  Pleasant A.A male appears to be in NAD, Well developed, Well nourished, alert  and cooperative Throat: Oral cavity and pharynx without inflammation, swelling or lesion.  Respiratory: Respirations even and unlabored. Lungs clear to auscultation bilaterally.   No wheezes, crackles, or rhonchi.  Cardiovascular: Normal S1, S2. Regular rate and rhythm. No peripheral edema, cyanosis or pallor.  Gastrointestinal:  Soft, nondistended, nontender. No rebound or guarding. Normal bowel sounds. No appreciable masses or hepatomegaly. Rectal:  Not performed.  Msk:  Symmetrical without gross deformities. Without edema, no deformity or joint abnormality.  Neurologic:  Alert and  oriented x4;  grossly normal neurologically.  Skin:   Dry and intact without significant lesions or rashes. Psychiatric: Oriented to person, place and time. Demonstrates good judgement and reason without abnormal affect or behaviors.   RELEVANT LABS AND IMAGING: CBC     Latest Ref Rng & Units 09/01/2023    2:14 PM 07/28/2023    1:58 PM 07/16/2023   12:59 PM  CBC  WBC 4.0 - 10.5 K/uL 7.0  7.5  9.8   Hemoglobin 13.0 - 17.0 g/dL 85.4  87.6  87.6   Hematocrit 39.0 - 52.0 % 42.2  36.6  35.7   Platelets 150 - 400 K/uL 190  210  199       CMP         Latest Ref Rng & Units 09/01/2023    2:06 PM 07/28/2023    1:58 PM 07/16/2023   12:59 PM  CMP  Glucose 70 - 99 mg/dL 894  91  95   BUN 6 - 20 mg/dL 9  6  12    Creatinine 0.61 - 1.24 mg/dL 9.06  9.09  8.89   Sodium 135 - 145 mmol/L 141  136  135   Potassium 3.5 - 5.1 mmol/L 3.6  4.0  4.0   Chloride 98 - 111 mmol/L 101  99  99   CO2 22 - 32 mmol/L 25  26  28    Calcium 8.9 - 10.3 mg/dL 9.7  9.7  8.8   Total Protein 6.5 - 8.1 g/dL 7.2  7.9  7.3    Total Bilirubin 0.0 - 1.2 mg/dL 0.6  0.6  0.9   Alkaline Phos 38 - 126 U/L 83  90  86   AST 15 - 41 U/L 61  47  65   ALT 0 - 44 U/L 23  36  58       Recent Labs       Lab Results  Component Value Date    TSH 0.51 07/14/2023      This SmartLink has not been configured with any valid records.    Recent Labs       Lab Results  Component Value Date    IRON 61 09/01/2023    TIBC 351 09/01/2023    FERRITIN 162 09/01/2023     09/01/23 labs show: b 12 - 191, LDH 271, AST 61, ALT 23, alk phosp 83, total bili 0.6 07/25/23 labs show: Sed rate 37, ANA - negative, RF factor- 12, CMV positive 07/16/23 labs show: PT/INR-  14/1.1,  HIV non reactive, Hepatitis panel non reactive, lipase 20   Assessment:     Encounter Diagnoses  Name Primary?   Loss of weight Yes   Poor appetite     Melena     Abdominal pain, epigastric     Elevated liver enzymes     Vitamin B 12 deficiency     Splenomegaly    38 year old male patient who presents for chronic upper abdominal pain and weight  loss.  Patient denies pain is triggered by eating.  He has been prescribed antiacids in the past but admits he has never taken anything scheduled.  Patient denies any GERD or dysphagia.  Patient does admit to using NSAIDs when he was sick back in February March along with dark stools. Will start him on Omeprazole  20mg  po daily and GERD diet in the case of NUD. He has lost over 15lbs with poor appetite. Will schedule upper GI endoscopy in [facility] with Dr. Charlanne to r/o gastritis, PUD, and/or h pylori.  At that time he did have mild anemia with hemoglobin 12.3. Most recent is 14.5.  It is also noted the patient has B12 deficiency and not currently taking vitamin supplementation.  Patient states he has had the abdominal pain prior to getting sick in March where he was found to have CMV.  At that time he it was also noted he had elevated LFTs and splenomegaly on CT angio.  Patient does drink a few times a month which sometimes can  include multiple drinks.  Recommended patient refrain from alcohol use.  Will recheck lab work to see if his liver enzymes have normalized will is complete ultrasound to evaluate liver and spleen.  If abnormal findings we will proceed with full liver workup.   ANA negative.  PLT 190 (07/14/23) AST 73>65>47>61 (07/14/23) ALT 67>58>36>23 Total Bili normal, Alk phosp normal Hemachromatosis gene negative Hepatitis panel negative     Plan: - Order US  Abd complete -Recheck CBC, CMP today, if still elevated proceed with full liver workup -Educated on Alcohol cessation -Recommended patient take b12 supplementation as prescribed -follow-up with Hem/Oncology as scheduled -Start omeprazole  20 mg po daily -GERD diet, no late meals 3-4 hours before lying down. -Schedule EGD in [facility] with Dr. Charlanne. The risks and benefits of EGD with possible biopsies and esophageal dilation were discussed with the patient who agrees to proceed.     Thank you for the courtesy of this consult. Please call me with any questions or concerns.    Deanna May, FNP-C [facility]    Attending physician's note   I have taken history, reviewed the chart and examined the patient. I performed a substantive portion of this encounter, including complete performance of at least one of the key components, in conjunction with the APP. I agree with the Advanced Practitioner's note, impression and recommendations.   For EGD today.   Anselm Charlanne, MD [facility] [phone]

## 2023-11-13 NOTE — Progress Notes (Signed)
 Report to PACU, RN, vss, BBS= Clear.

## 2023-11-13 NOTE — Op Note (Signed)
 [facility] Patient Name: Mitchell [name] Procedure Date: 11/13/2023 1:23 PM MRN: [id number] Endoscopist: Lynnie Bring , MD, [id number] Age: 38 Referring MD:  Date of Birth: [birth year] Gender: Male Account #: [id number] Procedure:                Upper GI endoscopy Indications:              Epigastric abdominal pain Medicines:                Monitored Anesthesia Care Procedure:                Pre-Anesthesia Assessment:                           - Prior to the procedure, a History and Physical                            was performed, and patient medications and                            allergies were reviewed. The patient's tolerance of                            previous anesthesia was also reviewed. The risks                            and benefits of the procedure and the sedation                            options and risks were discussed with the patient.                            All questions were answered, and informed consent                            was obtained. Prior Anticoagulants: The patient has                            taken no anticoagulant or antiplatelet agents. ASA                            Grade Assessment: II - A patient with mild systemic                            disease. After reviewing the risks and benefits,                            the patient was deemed in satisfactory condition to                            undergo the procedure.                           After obtaining informed consent, the endoscope was  passed under direct vision. Throughout the                            procedure, the patient's blood pressure, pulse, and                            oxygen saturations were monitored continuously. The                            GIF [id number] #[id number] was introduced through the                            mouth, and advanced to the second part of duodenum.                            The upper GI endoscopy was  accomplished without                            difficulty. The patient tolerated the procedure                            well. Scope In: Scope Out: Findings:                 The examined esophagus was normal.                           The Z-line was regular and was found 40 cm from the                            incisors.                           Localized mild inflammation characterized by                            erosions, erythema and friability was found in the                            gastric antrum. Biopsies were taken with a cold                            forceps for histology.                           The examined duodenum was normal. Complications:            No immediate complications. Estimated Blood Loss:     Estimated blood loss: none. Impression:               - Mild Gastritis (resolving) Recommendation:           - Patient has a contact number available for                            emergencies. The signs and symptoms of potential  delayed complications were discussed with the                            patient. Return to normal activities tomorrow.                            Written discharge instructions were provided to the                            patient.                           - Resume previous diet.                           - Continue present medications including omeprazole                             20 mg p.o. daily #90, 4RF.                           - Await pathology results.                           - No aspirin, ibuprofen , naproxen, or other                            non-steroidal anti-inflammatory drugs.                           - Patient feels much better on omeprazole . He has                            regained weight. Follow-up if with any GI problems                            in future.                           - The findings and recommendations were discussed                            with the patient's  family. Lynnie Bring, MD 11/13/2023 1:57:34 PM This report has been signed electronically.

## 2023-11-13 NOTE — Progress Notes (Signed)
 Pt's states no medical or surgical changes since previsit or office visit.

## 2023-11-17 ENCOUNTER — Telehealth: Payer: Self-pay | Admitting: *Deleted

## 2023-11-17 NOTE — Telephone Encounter (Signed)
  Follow up Call-     11/13/2023    1:00 PM  Call back number  Post procedure Call Back phone  # [phone]  Permission to leave phone message Yes     Patient questions:  Do you have a fever, pain , or abdominal swelling? No. Pain Score  0 *  Have you tolerated food without any problems? Yes.    Have you been able to return to your normal activities? Yes.    Do you have any questions about your discharge instructions: Diet   No. Medications  No. Follow up visit  No.  Do you have questions or concerns about your Care? No.  Actions: * If pain score is 4 or above: No action needed, pain <4.

## 2023-11-20 LAB — SURGICAL PATHOLOGY

## 2023-11-24 ENCOUNTER — Ambulatory Visit (HOSPITAL_BASED_OUTPATIENT_CLINIC_OR_DEPARTMENT_OTHER)
Admission: RE | Admit: 2023-11-24 | Discharge: 2023-11-24 | Disposition: A | Discharge status: Home / Self Care | Source: Ambulatory Visit | Attending: Gastroenterology | Admitting: Gastroenterology

## 2023-11-24 DIAGNOSIS — R161 Splenomegaly, not elsewhere classified: Secondary | ICD-10-CM | POA: Diagnosis not present

## 2023-11-24 DIAGNOSIS — R748 Abnormal levels of other serum enzymes: Secondary | ICD-10-CM | POA: Diagnosis not present

## 2023-11-24 DIAGNOSIS — R7989 Other specified abnormal findings of blood chemistry: Secondary | ICD-10-CM | POA: Diagnosis not present

## 2023-11-25 ENCOUNTER — Encounter: Admitting: Gastroenterology

## 2023-11-26 ENCOUNTER — Inpatient Hospital Stay

## 2023-11-26 ENCOUNTER — Inpatient Hospital Stay: Admitting: Medical Oncology

## 2023-12-02 ENCOUNTER — Ambulatory Visit: Payer: Self-pay | Admitting: Gastroenterology

## 2023-12-05 ENCOUNTER — Encounter: Admitting: Gastroenterology

## 2023-12-08 ENCOUNTER — Inpatient Hospital Stay: Attending: Hematology & Oncology

## 2023-12-08 ENCOUNTER — Inpatient Hospital Stay: Admitting: Medical Oncology

## 2023-12-08 ENCOUNTER — Encounter: Payer: Self-pay | Admitting: Medical Oncology

## 2023-12-08 VITALS — BP 143/97 | HR 80 | Temp 98.9°F | Resp 18 | Ht 70.0 in | Wt 222.0 lb

## 2023-12-08 DIAGNOSIS — R5383 Other fatigue: Secondary | ICD-10-CM

## 2023-12-08 DIAGNOSIS — R7989 Other specified abnormal findings of blood chemistry: Secondary | ICD-10-CM | POA: Insufficient documentation

## 2023-12-08 DIAGNOSIS — E538 Deficiency of other specified B group vitamins: Secondary | ICD-10-CM | POA: Insufficient documentation

## 2023-12-08 DIAGNOSIS — R61 Generalized hyperhidrosis: Secondary | ICD-10-CM | POA: Diagnosis not present

## 2023-12-08 DIAGNOSIS — D649 Anemia, unspecified: Secondary | ICD-10-CM | POA: Insufficient documentation

## 2023-12-08 LAB — CBC WITH DIFFERENTIAL (CANCER CENTER ONLY)
Abs Immature Granulocytes: 0.04 K/uL (ref 0.00–0.07)
Basophils Absolute: 0 K/uL (ref 0.0–0.1)
Basophils Relative: 1 %
Eosinophils Absolute: 0.1 K/uL (ref 0.0–0.5)
Eosinophils Relative: 2 %
HCT: 39.5 % (ref 39.0–52.0)
Hemoglobin: 13.7 g/dL (ref 13.0–17.0)
Immature Granulocytes: 1 %
Lymphocytes Relative: 36 %
Lymphs Abs: 2.2 K/uL (ref 0.7–4.0)
MCH: 29.8 pg (ref 26.0–34.0)
MCHC: 34.7 g/dL (ref 30.0–36.0)
MCV: 86.1 fL (ref 80.0–100.0)
Monocytes Absolute: 0.4 K/uL (ref 0.1–1.0)
Monocytes Relative: 7 %
Neutro Abs: 3.3 K/uL (ref 1.7–7.7)
Neutrophils Relative %: 53 %
Platelet Count: 245 K/uL (ref 150–400)
RBC: 4.59 MIL/uL (ref 4.22–5.81)
RDW: 12.3 % (ref 11.5–15.5)
WBC Count: 6.2 K/uL (ref 4.0–10.5)
nRBC: 0 % (ref 0.0–0.2)

## 2023-12-08 LAB — RETIC PANEL
Immature Retic Fract: 25 % — ABNORMAL HIGH (ref 2.3–15.9)
RBC.: 4.64 MIL/uL (ref 4.22–5.81)
Retic Count, Absolute: 110.9 K/uL (ref 19.0–186.0)
Retic Ct Pct: 2.4 % (ref 0.4–3.1)
Reticulocyte Hemoglobin: 32.3 pg (ref >27.9)

## 2023-12-08 LAB — CMP (CANCER CENTER ONLY)
ALT: 13 U/L (ref 0–44)
AST: 24 U/L (ref 15–41)
Albumin: 4.6 g/dL (ref 3.5–5.0)
Alkaline Phosphatase: 90 U/L (ref 38–126)
Anion gap: 10 (ref 5–15)
BUN: 15 mg/dL (ref 6–20)
CO2: 29 mmol/L (ref 22–32)
Calcium: 9.6 mg/dL (ref 8.9–10.3)
Chloride: 104 mmol/L (ref 98–111)
Creatinine: 1.06 mg/dL (ref 0.61–1.24)
GFR, Estimated: >60 mL/min (ref >60)
Glucose, Bld: 103 mg/dL — ABNORMAL HIGH (ref 70–99)
Potassium: 4.5 mmol/L (ref 3.5–5.1)
Sodium: 142 mmol/L (ref 135–145)
Total Bilirubin: 0.4 mg/dL (ref 0.0–1.2)
Total Protein: 7.4 g/dL (ref 6.5–8.1)

## 2023-12-08 LAB — VITAMIN B12: Vitamin B-12: 241 pg/mL (ref 180–914)

## 2023-12-08 LAB — FOLATE: Folate: 11.5 ng/mL (ref >5.9)

## 2023-12-08 NOTE — Progress Notes (Signed)
 Hematology and Oncology Follow Up Visit  Mitchell [name] [id number] 11-21-1985 38 y.o. 12/08/2023  Past Medical History:  Diagnosis Date   Anemia    Appendicitis 2015   Bowel obstruction (HCC)    Glaucoma    H/O left inguinal hernia repair    Moderate alcohol use disorder in controlled environment (HCC)    Tobacco dependence     Principle Diagnosis:  Anemia Elevated Ferritin  Current Therapy:   Oral B12- 1,000 mcg once daily - Pt has not yet started    Related Past History:  He was seen in the ER for fatigue/elevated LFTs/elevated ferritin levels on 07/16/2023 after being referred by his PCP. There he had additional lab work and imaging. He was found to have cytomegalovirus. Imaging showed mild splenomegaly. CBC showed a Hgb of 12.3, elevated LFTS (AST 65/ ALT 58), Sed rate of 37. Hep C negative, HIV Negative. Acute hepatitis Negative. It was thought that his elevated ferritin was due to an acute phase reactant. He also had a B12 level drawn by his PCP on 07/14/2023 that was low at 250.   He has been referred to GI for his anemia, chronic abdominal pains, elevated LFTs. This appointment is scheduled for tomorrow. Excessive tylenol  use. Variable ETOH use.   Interim History:  [name] is back for follow-up for his anemia and elevated ferritin level:  Today he states that he has been fair.     He has not started his B12 daily but plans to.   Tylenol  usage has been down. He reports that he is taking tylenol  1-2 times per week. He takes 500 mg when he takes this.   He has continued to reduce his ETOH intake.   No vomiting, abdominal pain is about the same, no nausea, vomiting, constipation. No fevers. He is still getting night sweats nightly. His baseline weight is 237lbs  Wt Readings from Last 3 Encounters:  12/08/23 222 lb 0.6 oz (100.7 kg)  11/13/23 216 lb 6.4 oz (98.2 kg)  10/27/23 213 lb 2 oz (96.7 kg)     Medications:   Current Outpatient Medications:     ARIPiprazole  (ABILIFY ) 5 MG tablet, Take 1 tablet (5 mg total) by mouth at bedtime., Disp: 30 tablet, Rfl: 2   fluticasone  (FLONASE ) 50 MCG/ACT nasal spray, Place 2 sprays into both nostrils daily., Disp: 16 g, Rfl: 1   hydrOXYzine  (VISTARIL ) 25 MG capsule, Take 1 capsule (25 mg total) by mouth every 8 (eight) hours as needed for anxiety., Disp: 30 capsule, Rfl: 2   meloxicam  (MOBIC ) 15 MG tablet, Take 1 tablet (15 mg total) by mouth daily., Disp: 90 tablet, Rfl: 1   omeprazole  (PRILOSEC) 20 MG capsule, Take 1 capsule (20 mg total) by mouth daily., Disp: 90 capsule, Rfl: 4   ondansetron  (ZOFRAN ) 4 MG tablet, Take 1 tablet (4 mg total) by mouth every 8 (eight) hours as needed for nausea or vomiting., Disp: 30 tablet, Rfl: 1   famotidine  (PEPCID ) 20 MG tablet, Take 1 tablet (20 mg total) by mouth 2 (two) times daily. (Patient not taking: Reported on 12/08/2023), Disp: 60 tablet, Rfl: 2  Allergies: No Known Allergies  Past Medical History, Surgical history, Social history, and Family History were reviewed and updated.  Review of Systems: Review of Systems - Oncology   Physical Exam:  height is 5' 10 (1.778 m) and weight is 222 lb 0.6 oz (100.7 kg). His oral temperature is 98.9 F (37.2 C). His blood pressure is 143/97 (abnormal)  and his pulse is 80. His respiration is 18 and oxygen saturation is 100%.   Physical Exam General: NAD Cardiovascular: regular rate and rhythm Pulmonary: clear ant fields Abdomen: soft, nontender, + bowel sounds GU: no suprapubic tenderness Extremities: no edema, no joint deformities Skin: no rashes Neurological: Weakness but otherwise nonfocal   Lab Results  Component Value Date   WBC 6.2 12/08/2023   HGB 13.7 12/08/2023   HCT 39.5 12/08/2023   MCV 86.1 12/08/2023   PLT 245 12/08/2023     Chemistry      Component Value Date/Time   NA 139 10/27/2023 1217   NA 141 04/20/2014 0608   K 4.2 10/27/2023 1217   K 3.6 04/20/2014 0608   CL 103 10/27/2023  1217   CL 106 04/20/2014 0608   CO2 27 10/27/2023 1217   CO2 29 04/20/2014 0608   BUN 18 10/27/2023 1217   BUN 8 04/20/2014 0608   CREATININE 0.89 10/27/2023 1217   CREATININE 0.93 09/01/2023 1406   CREATININE 1.11 04/20/2014 0608      Component Value Date/Time   CALCIUM 9.6 10/27/2023 1217   CALCIUM 8.2 (L) 04/20/2014 0608   ALKPHOS 96 10/27/2023 1217   ALKPHOS 76 04/20/2014 0608   AST 19 10/27/2023 1217   AST 61 (H) 09/01/2023 1406   ALT 15 10/27/2023 1217   ALT 23 09/01/2023 1406   ALT 19 04/20/2014 0608   BILITOT 0.7 10/27/2023 1217   BILITOT 0.6 09/01/2023 1406     Encounter Diagnoses  Name Primary?   Other fatigue    Elevated ferritin Yes   B12 deficiency     Assessment and Plan- Patient is a 38 y.o. male who was referred to our office for elevated ferritin and anemia. He was found to be deficient in B12 and was started on oral supplementation. Based on his history it was suspected that his elevated ferritin level was possibly related to liver disease. He was asked to reduce ETOH and tylenol  use.He has since seen GI.     CBC today is normal  CMP pending Still having night sweats of unknown origin- weight is up which is reassuring. Will continue to monitor and obtain updated TSH at next visit. May need/benefit from a sleep study. Abdominal US  normal(11/24/2023)   Disposition: RTC 1 month APP, labs (CBC, CMP, B12, iron, ferritin, folate, retic, TSH)   Lauraine Dais PA-C 7/28/202511:20 AM

## 2023-12-09 ENCOUNTER — Ambulatory Visit: Payer: Self-pay | Admitting: Medical Oncology

## 2023-12-09 LAB — TSH: TSH: 0.72 u[IU]/mL (ref 0.350–4.500)

## 2023-12-11 LAB — HEMOCHROMATOSIS DNA-PCR(C282Y,H63D)

## 2023-12-15 ENCOUNTER — Ambulatory Visit: Payer: Self-pay | Admitting: Medical Oncology

## 2023-12-26 ENCOUNTER — Other Ambulatory Visit (HOSPITAL_BASED_OUTPATIENT_CLINIC_OR_DEPARTMENT_OTHER): Payer: Self-pay

## 2023-12-26 ENCOUNTER — Telehealth: Admitting: Physician Assistant

## 2023-12-26 DIAGNOSIS — J069 Acute upper respiratory infection, unspecified: Secondary | ICD-10-CM | POA: Diagnosis not present

## 2023-12-26 MED ORDER — PROMETHAZINE-DM 6.25-15 MG/5ML PO SYRP
5.0000 mL | ORAL_SOLUTION | Freq: Four times a day (QID) | ORAL | 0 refills | Status: AC | PRN
  Filled 2023-12-26: qty 118, 6d supply, fill #0

## 2023-12-26 MED ORDER — FLUTICASONE PROPIONATE 50 MCG/ACT NA SUSP
2.0000 | Freq: Every day | NASAL | 0 refills | Status: AC
  Filled 2023-12-26: qty 16, 30d supply, fill #0

## 2023-12-26 NOTE — Progress Notes (Signed)
E-Visit for Upper Respiratory Infection   We are sorry you are not feeling well.  Here is how we plan to help!  Based on what you have shared with me, it looks like you may have a viral upper respiratory infection.  Upper respiratory infections are caused by a large number of viruses; however, rhinovirus is the most common cause.   Symptoms vary from person to person, with common symptoms including sore throat, cough, fatigue or lack of energy and feeling of general discomfort.  A low-grade fever of up to 100.4 may present, but is often uncommon.  Symptoms vary however, and are closely related to a person's age or underlying illnesses.  The most common symptoms associated with an upper respiratory infection are nasal discharge or congestion, cough, sneezing, headache and pressure in the ears and face.  These symptoms usually persist for about 3 to 10 days, but can last up to 2 weeks.  It is important to know that upper respiratory infections do not cause serious illness or complications in most cases.    Upper respiratory infections can be transmitted from person to person, with the most common method of transmission being a person's hands.  The virus is able to live on the skin and can infect other persons for up to 2 hours after direct contact.  Also, these can be transmitted when someone coughs or sneezes; thus, it is important to cover the mouth to reduce this risk.  To keep the spread of the illness at bay, good hand hygiene is very important.  This is an infection that is most likely caused by a virus. There are no specific treatments other than to help you with the symptoms until the infection runs its course.  We are sorry you are not feeling well.  Here is how we plan to help!   For nasal congestion, you may use an oral decongestants such as Mucinex D or if you have glaucoma or high blood pressure use plain Mucinex.  Saline nasal spray or nasal drops can help and can safely be used as often as  needed for congestion.  For your congestion, I have prescribed Fluticasone nasal spray one spray in each nostril twice a day  If you do not have a history of heart disease, hypertension, diabetes or thyroid disease, prostate/bladder issues or glaucoma, you may also use Sudafed to treat nasal congestion.  It is highly recommended that you consult with a pharmacist or your primary care physician to ensure this medication is safe for you to take.     If you have a cough, you may use cough suppressants such as Delsym and Robitussin.  If you have glaucoma or high blood pressure, you can also use Coricidin HBP.   For cough I have prescribed for you Promethazine DM cough syrup Take 5mL every 6 hours as needed for cough  If you have a sore or scratchy throat, use a saltwater gargle-  to  teaspoon of salt dissolved in a 4-ounce to 8-ounce glass of warm water.  Gargle the solution for approximately 15-30 seconds and then spit.  It is important not to swallow the solution.  You can also use throat lozenges/cough drops and Chloraseptic spray to help with throat pain or discomfort.  Warm or cold liquids can also be helpful in relieving throat pain.  For headache, pain or general discomfort, you can use Ibuprofen or Tylenol as directed.   Some authorities believe that zinc sprays or the use of Echinacea   may shorten the course of your symptoms.   HOME CARE Only take medications as instructed by your medical team. Be sure to drink plenty of fluids. Water is fine as well as fruit juices, sodas and electrolyte beverages. You may want to stay away from caffeine or alcohol. If you are nauseated, try taking small sips of liquids. How do you know if you are getting enough fluid? Your urine should be a pale yellow or almost colorless. Get rest. Taking a steamy shower or using a humidifier may help nasal congestion and ease sore throat pain. You can place a towel over your head and breathe in the steam from hot water  coming from a faucet. Using a saline nasal spray works much the same way. Cough drops, hard candies and sore throat lozenges may ease your cough. Avoid close contacts especially the very young and the elderly Cover your mouth if you cough or sneeze Always remember to wash your hands.   GET HELP RIGHT AWAY IF: You develop worsening fever. If your symptoms do not improve within 10 days You develop yellow or green discharge from your nose over 3 days. You have coughing fits You develop a severe head ache or visual changes. You develop shortness of breath, difficulty breathing or start having chest pain Your symptoms persist after you have completed your treatment plan  MAKE SURE YOU  Understand these instructions. Will watch your condition. Will get help right away if you are not doing well or get worse.  Thank you for choosing an e-visit.  Your e-visit answers were reviewed by a board certified advanced clinical practitioner to complete your personal care plan. Depending upon the condition, your plan could have included both over the counter or prescription medications.  Please review your pharmacy choice. Make sure the pharmacy is open so you can pick up prescription now. If there is a problem, you may contact your provider through MyChart messaging and have the prescription routed to another pharmacy.  Your safety is important to us. If you have drug allergies check your prescription carefully.   For the next 24 hours you can use MyChart to ask questions about today's visit, request a non-urgent call back, or ask for a work or school excuse. You will get an email in the next two days asking about your experience. I hope that your e-visit has been valuable and will speed your recovery.  I have spent 5 minutes in review of e-visit questionnaire, review and updating patient chart, medical decision making and response to patient.   Shayla Heming M Calen Geister, PA-C  

## 2024-01-05 ENCOUNTER — Inpatient Hospital Stay: Attending: Medical Oncology

## 2024-01-05 ENCOUNTER — Inpatient Hospital Stay: Admitting: Medical Oncology

## 2024-01-07 ENCOUNTER — Other Ambulatory Visit

## 2024-01-07 ENCOUNTER — Inpatient Hospital Stay: Admitting: Medical Oncology

## 2024-01-08 ENCOUNTER — Telehealth: Payer: Self-pay | Admitting: Medical Oncology

## 2024-01-08 NOTE — Telephone Encounter (Signed)
 LVM for patient to return call for scheduling missed office visit on 01/07/24.

## 2024-01-14 ENCOUNTER — Telehealth: Payer: Self-pay | Admitting: Family

## 2024-01-14 NOTE — Telephone Encounter (Signed)
 Called to reschedule missed appt per inbasket. LVM to return call for scheduling.

## 2024-01-19 ENCOUNTER — Telehealth: Payer: Self-pay | Admitting: Family

## 2024-01-19 NOTE — Telephone Encounter (Signed)
 Called to r/s missed appts per inbasket. Unable to LVM to return call for scheduling. 3rd attempt.

## 2024-02-10 DIAGNOSIS — H40013 Open angle with borderline findings, low risk, bilateral: Secondary | ICD-10-CM | POA: Diagnosis not present
